# Patient Record
Sex: Female | Born: 1965 | Race: Black or African American | Hispanic: No | Marital: Single | Smoking: Current every day smoker
Health system: Southern US, Community
[De-identification: ages and names within clinical notes are randomized; demographics above are authoritative.]

## PROBLEM LIST (undated history)

## (undated) DIAGNOSIS — F329 Major depressive disorder, single episode, unspecified: Secondary | ICD-10-CM

## (undated) DIAGNOSIS — F32A Depression, unspecified: Secondary | ICD-10-CM

## (undated) DIAGNOSIS — I499 Cardiac arrhythmia, unspecified: Secondary | ICD-10-CM

## (undated) DIAGNOSIS — F17200 Nicotine dependence, unspecified, uncomplicated: Secondary | ICD-10-CM

## (undated) DIAGNOSIS — F1910 Other psychoactive substance abuse, uncomplicated: Secondary | ICD-10-CM

## (undated) DIAGNOSIS — I10 Essential (primary) hypertension: Secondary | ICD-10-CM

## (undated) DIAGNOSIS — B009 Herpesviral infection, unspecified: Secondary | ICD-10-CM

## (undated) DIAGNOSIS — F419 Anxiety disorder, unspecified: Secondary | ICD-10-CM

## (undated) DIAGNOSIS — F1110 Opioid abuse, uncomplicated: Secondary | ICD-10-CM

## (undated) HISTORY — DX: Cardiac arrhythmia, unspecified: I49.9

## (undated) HISTORY — PX: TUBAL LIGATION: SHX77

## (undated) HISTORY — DX: Depression, unspecified: F32.A

## (undated) HISTORY — DX: Opioid abuse, uncomplicated: F11.10

## (undated) HISTORY — DX: Major depressive disorder, single episode, unspecified: F32.9

## (undated) HISTORY — DX: Herpesviral infection, unspecified: B00.9

## (undated) HISTORY — DX: Essential (primary) hypertension: I10

---

## 2010-12-06 ENCOUNTER — Observation Stay (HOSPITAL_COMMUNITY)
Admission: EM | Admit: 2010-12-06 | Discharge: 2010-12-07 | Payer: Self-pay | Source: Home / Self Care | Admitting: Emergency Medicine

## 2010-12-13 ENCOUNTER — Emergency Department (HOSPITAL_COMMUNITY)
Admission: EM | Admit: 2010-12-13 | Discharge: 2010-12-13 | Payer: Self-pay | Source: Home / Self Care | Admitting: Family Medicine

## 2011-02-01 ENCOUNTER — Encounter: Payer: Self-pay | Admitting: Family Medicine

## 2011-02-01 DIAGNOSIS — E1165 Type 2 diabetes mellitus with hyperglycemia: Secondary | ICD-10-CM | POA: Insufficient documentation

## 2011-02-01 DIAGNOSIS — I499 Cardiac arrhythmia, unspecified: Secondary | ICD-10-CM | POA: Insufficient documentation

## 2011-02-01 DIAGNOSIS — B009 Herpesviral infection, unspecified: Secondary | ICD-10-CM | POA: Insufficient documentation

## 2011-02-03 ENCOUNTER — Ambulatory Visit (INDEPENDENT_AMBULATORY_CARE_PROVIDER_SITE_OTHER): Payer: Medicaid Other | Admitting: Family Medicine

## 2011-02-03 ENCOUNTER — Encounter: Payer: Self-pay | Admitting: Family Medicine

## 2011-02-03 VITALS — BP 124/84 | HR 91 | Temp 98.0°F | Ht 69.0 in | Wt 175.1 lb

## 2011-02-03 DIAGNOSIS — IMO0001 Reserved for inherently not codable concepts without codable children: Secondary | ICD-10-CM

## 2011-02-03 DIAGNOSIS — I499 Cardiac arrhythmia, unspecified: Secondary | ICD-10-CM

## 2011-02-03 DIAGNOSIS — B009 Herpesviral infection, unspecified: Secondary | ICD-10-CM

## 2011-02-03 DIAGNOSIS — M791 Myalgia, unspecified site: Secondary | ICD-10-CM | POA: Insufficient documentation

## 2011-02-03 DIAGNOSIS — I10 Essential (primary) hypertension: Secondary | ICD-10-CM

## 2011-02-03 DIAGNOSIS — E119 Type 2 diabetes mellitus without complications: Secondary | ICD-10-CM

## 2011-02-03 LAB — CK: Total CK: 78 U/L (ref 7–177)

## 2011-02-03 NOTE — Assessment & Plan Note (Signed)
Pt believes this is related to crack cocaine use. No palpitations, CP, SOB. Heart RRR w/o murmur on exam today. Will monitor.

## 2011-02-03 NOTE — Patient Instructions (Addendum)
It was so nice meeting you! I'm glad to hear that you have been clean for 2 months.  Keep up the great work! Make sure you keep taking all of your medicines as prescribed.  I do not think that they are the cause of your achiness. We are checking some labs today.  I will call you if there is anything abnormal, otherwise I will send you a letter with the results. Please call Dr. Remus Blake office and have him fax all of his lab results and his office visit with you, so that we do not have repeat any labs that were just done. Please check your feet every night for sores or cuts. Please come back and see me for a well-woman exam, including a PAP smear and a breast exam.

## 2011-02-03 NOTE — Assessment & Plan Note (Signed)
Normotensive.  Will get initial BP from Dr. Remus Blake office. Pt is to have labs faxed.  If no CMET draw, will obtain one at next visit.

## 2011-02-03 NOTE — Assessment & Plan Note (Signed)
Has not had break out since diagnosed ~8 months ago.  On suppressive txt.

## 2011-02-03 NOTE — Assessment & Plan Note (Signed)
Will check CK and ESR. CMET likely done at endocrinologist.  Will have pt fax lab results so that we can have on file.  Likely not medication related, more likely related to depression vs h/o drug abuse vs diabetic neuropathy.  Will continue to monitor. Pt does not want to start medications at this time, could consider lyrica in the future.

## 2011-02-03 NOTE — Progress Notes (Signed)
Addended by: Terese Door on: 02/03/2011 03:54 PM   Modules accepted: Orders

## 2011-02-03 NOTE — Assessment & Plan Note (Signed)
Diagnosed ~15 yrs ago, seen by Dr. Lucianne Muss w/ endocrinology  Started on lantus and novolog, gabapentin for neuropathy A1c done at endo clinic, will have pt get labs faxed

## 2011-02-03 NOTE — Progress Notes (Signed)
Subjective:    Patient ID: Patricia Charles, female    DOB: [birth year], 45 y.o.   MRN: [id number]  This is a new pt coming in to establish care.  Has stopped using drugs 2 months ago, and has not seen a doctor in a "long time."  Was seen by Dr. Lucianne Muss (endocrinology) last month because she has known Diabetes, also found to have HTN at that time.  DM: Has had diagnosis for 15 yrs but has not been taking meds regularly until these past 2 months. A1c per report was in the 9's last month.  Saw opthalmologic yesterday, said there was no retinopathy, cataracts, etc.  Also has complication of peripheral neuropathy, started on gabapentin by Dr. Lucianne Muss.  Herpes: First and only outbreak ~8 months ago. Started on acyclovir. Not currently sexually active.  HTN: Dr. Lucianne Muss started her on lisinopril. No CP, SOB, dizziness, or other CV symptoms.  H/o drug abuse: Mostly crack cocaine and THC. Drank 2-3 drinks when doing drugs. Never anything IV.  Has been clean for 2 months, currently living in recovery house and doing well.  H/o depression: Thinks it is because she is not doing the drugs anymore.  Has therapy M-F at her residence. Has been Rx'ed abilify, visteril, celexa but has never taken them b/c she does not want to.  Achiness: Has been ~6 weeks, unsure if it is related to starting to take all of her medications. Back, shoulders, and arms are bad during the day, legs are bad at night. 7/10. Relieved by OTC analgesics.   Muscle Pain This is a new problem. The current episode started more than 1 month ago. The problem occurs constantly. The problem is unchanged. The context of the pain is unknown. The pain is present in the chest, lower back, left arm, right arm, right lower leg, right upper leg, left lower leg, left upper leg and upper back. The pain is medium. The symptoms are aggravated by nothing. Pertinent negatives include no abdominal pain, chest pain, constipation, diarrhea, dysuria, fatigue, fever,  headaches, joint swelling, nausea, rash, shortness of breath, vaginal discharge, vomiting, weakness or wheezing. Past treatments include OTC NSAID and acetaminophen. The treatment provided mild relief. There is no swelling present. She has been sleeping poorly. There is no history of chronic back pain, rheumatic disease or sickle cell disease.      Review of Systems  Constitutional: Negative for fever, chills, fatigue and unexpected weight change.  HENT: Negative for congestion, sore throat, rhinorrhea, sneezing, mouth sores, dental problem and voice change.   Eyes: Negative for photophobia and visual disturbance.  Respiratory: Negative for cough, chest tightness, shortness of breath and wheezing.   Cardiovascular: Negative for chest pain, palpitations and leg swelling.  Gastrointestinal: Negative for nausea, vomiting, abdominal pain, diarrhea and constipation.  Genitourinary: Negative for dysuria, urgency, flank pain, vaginal bleeding, vaginal discharge, difficulty urinating and genital sores.  Musculoskeletal: Positive for myalgias. Negative for joint swelling, arthralgias and gait problem.  Skin: Negative for rash.  Neurological: Negative for dizziness, syncope, weakness, light-headedness, numbness and headaches.  Psychiatric/Behavioral: Negative for suicidal ideas, hallucinations, confusion, self-injury, dysphoric mood and agitation. The patient is not nervous/anxious and is not hyperactive.        Objective:   Physical Exam  Constitutional: She is oriented to person, place, and time. She appears well-developed and well-nourished. No distress.  HENT:  Head: Normocephalic and atraumatic.  Right Ear: External ear normal.  Left Ear: External ear normal.  Nose: Nose normal.  Mouth/Throat:  Oropharynx is clear and moist. No oropharyngeal exudate.  Eyes: Conjunctivae and EOM are normal. Pupils are equal, round, and reactive to light.  Fundoscopic exam:      The right eye shows no  hemorrhage and no papilledema.       The left eye shows no hemorrhage and no papilledema.  Neck: Normal range of motion. Neck supple. No thyromegaly present.  Cardiovascular: Normal rate, regular rhythm and intact distal pulses.  Exam reveals no gallop and no friction rub.   No murmur heard. Pulmonary/Chest: Effort normal and breath sounds normal. She has no wheezes. She has no rales.  Abdominal: Soft. Bowel sounds are normal. She exhibits no distension and no mass. There is no tenderness. There is no guarding.  Musculoskeletal: Normal range of motion. She exhibits tenderness. She exhibits no edema.  Lymphadenopathy:    She has no cervical adenopathy.  Neurological: She is alert and oriented to person, place, and time. No cranial nerve deficit. Coordination normal.  Skin: Skin is warm and dry. No abrasion and no rash noted.  Psychiatric: She has a normal mood and affect. Her behavior is normal. Judgment and thought content normal.          Assessment & Plan:

## 2011-02-27 LAB — POCT I-STAT, CHEM 8
BUN: 16 mg/dL (ref 6–23)
Calcium, Ion: 1.12 mmol/L (ref 1.12–1.32)
Chloride: 101 mEq/L (ref 96–112)
Chloride: 92 mEq/L — ABNORMAL LOW (ref 96–112)
Creatinine, Ser: 1.2 mg/dL (ref 0.4–1.2)
Glucose, Bld: 204 mg/dL — ABNORMAL HIGH (ref 70–99)
Potassium: 3.9 mEq/L (ref 3.5–5.1)

## 2011-02-27 LAB — DIFFERENTIAL
Eosinophils Relative: 1 % (ref 0–5)
Lymphs Abs: 3.8 10*3/uL (ref 0.7–4.0)
Monocytes Absolute: 0.6 10*3/uL (ref 0.1–1.0)
Monocytes Relative: 7 % (ref 3–12)
Neutro Abs: 3.4 10*3/uL (ref 1.7–7.7)

## 2011-02-27 LAB — LIPASE, BLOOD: Lipase: 20 U/L (ref 11–59)

## 2011-02-27 LAB — BASIC METABOLIC PANEL
BUN: 11 mg/dL (ref 6–23)
Calcium: 8.5 mg/dL (ref 8.4–10.5)
Creatinine, Ser: 0.96 mg/dL (ref 0.4–1.2)
GFR calc non Af Amer: >60 mL/min (ref >60)
Glucose, Bld: 114 mg/dL — ABNORMAL HIGH (ref 70–99)

## 2011-02-27 LAB — CBC
HCT: 52.5 % — ABNORMAL HIGH (ref 36.0–46.0)
Hemoglobin: 18.3 g/dL — ABNORMAL HIGH (ref 12.0–15.0)
MCH: 29.7 pg (ref 26.0–34.0)
MCHC: 34.9 g/dL (ref 30.0–36.0)
MCV: 85.1 fL (ref 78.0–100.0)
Platelets: 202 10*3/uL (ref 150–400)

## 2011-02-27 LAB — RAPID URINE DRUG SCREEN, HOSP PERFORMED
Barbiturates: NOT DETECTED
Benzodiazepines: NOT DETECTED
Cocaine: POSITIVE — AB
Opiates: NOT DETECTED

## 2011-02-27 LAB — URINALYSIS, ROUTINE W REFLEX MICROSCOPIC
Protein, ur: 30 mg/dL — AB
Specific Gravity, Urine: 1.041 — ABNORMAL HIGH (ref 1.005–1.030)

## 2011-02-27 LAB — GLUCOSE, CAPILLARY
Glucose-Capillary: 196 mg/dL — ABNORMAL HIGH (ref 70–99)
Glucose-Capillary: 268 mg/dL — ABNORMAL HIGH (ref 70–99)

## 2011-02-27 LAB — COMPREHENSIVE METABOLIC PANEL
AST: 40 U/L — ABNORMAL HIGH (ref 0–37)
Albumin: 3.8 g/dL (ref 3.5–5.2)
Alkaline Phosphatase: 81 U/L (ref 39–117)
Calcium: 10.3 mg/dL (ref 8.4–10.5)
Chloride: 89 mEq/L — ABNORMAL LOW (ref 96–112)
GFR calc Af Amer: >60 mL/min (ref >60)
GFR calc non Af Amer: >60 mL/min (ref >60)
Potassium: 4.4 mEq/L (ref 3.5–5.1)
Total Bilirubin: 1.5 mg/dL — ABNORMAL HIGH (ref 0.3–1.2)

## 2011-02-27 LAB — URINE MICROSCOPIC-ADD ON

## 2011-04-19 ENCOUNTER — Encounter: Payer: Self-pay | Admitting: Family Medicine

## 2011-05-24 ENCOUNTER — Encounter: Payer: Medicaid Other | Admitting: Family Medicine

## 2011-08-02 ENCOUNTER — Ambulatory Visit: Payer: Medicaid Other | Admitting: Family Medicine

## 2011-08-10 ENCOUNTER — Encounter: Payer: Self-pay | Admitting: Family Medicine

## 2011-08-10 ENCOUNTER — Ambulatory Visit (INDEPENDENT_AMBULATORY_CARE_PROVIDER_SITE_OTHER): Payer: Medicaid Other | Admitting: Family Medicine

## 2011-08-10 ENCOUNTER — Other Ambulatory Visit (HOSPITAL_COMMUNITY)
Admission: RE | Admit: 2011-08-10 | Discharge: 2011-08-10 | Disposition: A | Discharge status: Home / Self Care | Payer: Medicaid Other | Source: Ambulatory Visit | Attending: Family Medicine | Admitting: Family Medicine

## 2011-08-10 DIAGNOSIS — B009 Herpesviral infection, unspecified: Secondary | ICD-10-CM

## 2011-08-10 DIAGNOSIS — Z0000 Encounter for general adult medical examination without abnormal findings: Secondary | ICD-10-CM

## 2011-08-10 DIAGNOSIS — Z124 Encounter for screening for malignant neoplasm of cervix: Secondary | ICD-10-CM

## 2011-08-10 DIAGNOSIS — Z01419 Encounter for gynecological examination (general) (routine) without abnormal findings: Secondary | ICD-10-CM

## 2011-08-10 DIAGNOSIS — Z23 Encounter for immunization: Secondary | ICD-10-CM

## 2011-08-10 DIAGNOSIS — E119 Type 2 diabetes mellitus without complications: Secondary | ICD-10-CM

## 2011-08-10 DIAGNOSIS — E785 Hyperlipidemia, unspecified: Secondary | ICD-10-CM

## 2011-08-10 DIAGNOSIS — I10 Essential (primary) hypertension: Secondary | ICD-10-CM

## 2011-08-10 DIAGNOSIS — N760 Acute vaginitis: Secondary | ICD-10-CM

## 2011-08-10 MED ORDER — TETANUS-DIPHTH-ACELL PERTUSSIS 5-2.5-18.5 LF-MCG/0.5 IM SUSP: 0.5000 mL | Freq: Once | INTRAMUSCULAR | Status: DC

## 2011-08-10 MED ORDER — ACYCLOVIR 400 MG PO TABS: 400.0000 mg | ORAL_TABLET | Freq: Two times a day (BID) | ORAL | Status: DC

## 2011-08-10 NOTE — Assessment & Plan Note (Signed)
Overall doing well, no concerns or questions. PAP done today. Next will be in 3 years as pt have never had an abnormal.

## 2011-08-10 NOTE — Assessment & Plan Note (Signed)
No outbreaks. Will refill acyclovir.

## 2011-08-10 NOTE — Assessment & Plan Note (Signed)
Diagnosis must have been made by Dr. Lucianne Muss as he started pravastatin.  Asked pt to have records/labs faxed so that we have them as well.  Will not make any changes at this time.

## 2011-08-10 NOTE — Progress Notes (Signed)
  Subjective:     Patricia Charles is a 45 y.o. female and is here for a comprehensive physical exam. The patient reports no problems.  History   Social History  . Marital Status: Single    Spouse Name: N/A    Number of Children: 1  . Years of Education: some colle   Occupational History  . Unemployed     Social History Main Topics  . Smoking status: Former Smoker -- 25 years    Types: Cigarettes    Quit date: 12/04/2010  . Smokeless tobacco: Never Used  . Alcohol Use: No     drank when she used  . Drug Use: Yes    Special: "Crack" cocaine, Marijuana     in past  . Sexually Active: Not Currently    Birth Control/ Protection: Surgical   Other Topics Concern  . Not on file   Social History Narrative   01/2011: Currently lives [facility] with daughter, Hal Morales, born in 2006; heterosexual, never been married; enjoys reading and attending NA mtgs    Health Maintenance  Topic Date Due  . Pap Smear  Oct 28, 1968  . Tetanus/tdap  12/19/1986    The following portions of the patient's history were reviewed and updated as appropriate: allergies, current medications, past family history, past medical history, past social history, past surgical history and problem list.  Review of Systems Constitutional: negative for chills and fevers Eyes: negative for irritation, redness and visual disturbance Ears, nose, mouth, throat, and face: negative for earaches, hoarseness, nasal congestion, sore mouth and sore throat Respiratory: negative for cough, dyspnea on exertion and wheezing Cardiovascular: negative for chest pain, chest pressure/discomfort, exertional chest pressure/discomfort, fatigue and syncope Gastrointestinal: negative for abdominal pain, change in bowel habits and constipation Genitourinary:negative for sexual problems and vaginal discharge and dysuria Integument/breast: negative for breast lump and breast tenderness Hematologic/lymphatic: negative for  bleeding Musculoskeletal:negative for back pain and myalgias Neurological: negative for dizziness, headaches, memory problems and weakness Behavioral/Psych: positive for fatigue, negative for abusive relationship, anxiety, depression, sleep disturbance and tobacco use Endocrine: negative for temperature intolerance   Objective:    BP 126/89  Pulse 71  Temp(Src) 98.2 F (36.8 C) (Oral)  Wt 215 lb 12.8 oz (97.886 kg) General appearance: alert, cooperative, appears stated age and no distress Head: Normocephalic, without obvious abnormality, atraumatic Eyes: conjunctivae/corneas clear. PERRL, EOM's intact. Fundi benign. Ears: normal TM's and external ear canals both ears Nose: Nares normal. Septum midline. Mucosa normal. No drainage or sinus tenderness. Throat: lips, mucosa, and tongue normal; teeth and gums normal Neck: no adenopathy, supple, symmetrical, trachea midline and thyroid not enlarged, symmetric, no tenderness/mass/nodules Lungs: clear to auscultation bilaterally Heart: regular rate and rhythm, S1, S2 normal, no murmur, click, rub or gallop Abdomen: soft, non-tender; bowel sounds normal; no masses,  no organomegaly Pelvic: cervix normal in appearance, external genitalia normal, no adnexal masses or tenderness, no cervical motion tenderness, rectovaginal septum normal, uterus normal size, shape, and consistency and vagina normal without discharge Extremities: extremities normal, atraumatic, no cyanosis or edema Pulses: 2+ and symmetric Skin: Skin color, texture, turgor normal. No rashes or lesions or eczema - ankle(s) bilateral    Assessment:    Healthy female exam. ?Contact dermatitis rash bilateral ankles     Plan:     See After Visit Summary for Counseling Recommendations

## 2011-08-10 NOTE — Assessment & Plan Note (Signed)
BP at goal, 126/89.  Lisinopril increased to 10mg  by Lucianne Muss. Continue.

## 2011-08-10 NOTE — Assessment & Plan Note (Signed)
Last A1c, per pt, was 6.  Med changes by Dr. Lucianne Muss.  Informed pt she could have DM followed here if she would like.  Next appt w/ Lucianne Muss in Sept.

## 2011-08-10 NOTE — Patient Instructions (Signed)
Everything looks great! If you and Dr. Lucianne Muss would like, we can start following you for all of your chronic issues such as Diabetes, High Cholesterol, etc.  Just let me know if you would prefer that. The rash on your ankles looks like an irritation from either bug bites, shoes, or walking through something.  You can try over the counter hydrocortisone cream or anti-itch creams/lotions.  If these do not help please come back and we can try something prescription strength. If you would like for [place] to manage your medical conditions, please come back in 3 months, otherwise, come back in 1 year for your next well woman exam.  You will NOT need a pap smear at that time. It was great to see you!

## 2011-08-14 ENCOUNTER — Encounter: Payer: Self-pay | Admitting: Family Medicine

## 2011-12-21 ENCOUNTER — Other Ambulatory Visit: Payer: Self-pay | Admitting: Family Medicine

## 2011-12-21 MED ORDER — METFORMIN HCL ER 750 MG PO TB24: 1500.0000 mg | ORAL_TABLET | Freq: Every day | ORAL | Status: DC

## 2012-01-29 ENCOUNTER — Emergency Department (HOSPITAL_COMMUNITY)
Admission: EM | Admit: 2012-01-29 | Discharge: 2012-01-30 | Disposition: A | Discharge status: Home / Self Care | Payer: Medicaid Other | Attending: Emergency Medicine | Admitting: Emergency Medicine

## 2012-01-29 ENCOUNTER — Encounter (HOSPITAL_COMMUNITY): Payer: Self-pay | Admitting: Emergency Medicine

## 2012-01-29 DIAGNOSIS — I10 Essential (primary) hypertension: Secondary | ICD-10-CM | POA: Insufficient documentation

## 2012-01-29 DIAGNOSIS — E119 Type 2 diabetes mellitus without complications: Secondary | ICD-10-CM | POA: Insufficient documentation

## 2012-01-29 DIAGNOSIS — Z79899 Other long term (current) drug therapy: Secondary | ICD-10-CM | POA: Insufficient documentation

## 2012-01-29 DIAGNOSIS — A5903 Trichomonal cystitis and urethritis: Secondary | ICD-10-CM | POA: Insufficient documentation

## 2012-01-29 DIAGNOSIS — R3589 Other polyuria: Secondary | ICD-10-CM | POA: Insufficient documentation

## 2012-01-29 DIAGNOSIS — F3289 Other specified depressive episodes: Secondary | ICD-10-CM | POA: Insufficient documentation

## 2012-01-29 DIAGNOSIS — F329 Major depressive disorder, single episode, unspecified: Secondary | ICD-10-CM | POA: Insufficient documentation

## 2012-01-29 DIAGNOSIS — N390 Urinary tract infection, site not specified: Secondary | ICD-10-CM

## 2012-01-29 DIAGNOSIS — R358 Other polyuria: Secondary | ICD-10-CM | POA: Insufficient documentation

## 2012-01-29 DIAGNOSIS — Z794 Long term (current) use of insulin: Secondary | ICD-10-CM | POA: Insufficient documentation

## 2012-01-29 DIAGNOSIS — R631 Polydipsia: Secondary | ICD-10-CM | POA: Insufficient documentation

## 2012-01-29 LAB — DIFFERENTIAL
Basophils Absolute: 0 10*3/uL (ref 0.0–0.1)
Basophils Relative: 0 % (ref 0–1)
Lymphocytes Relative: 48 % — ABNORMAL HIGH (ref 12–46)
Monocytes Absolute: 0.5 10*3/uL (ref 0.1–1.0)
Monocytes Relative: 7 % (ref 3–12)
Neutro Abs: 2.9 10*3/uL (ref 1.7–7.7)
Neutrophils Relative %: 42 % — ABNORMAL LOW (ref 43–77)

## 2012-01-29 LAB — GLUCOSE, CAPILLARY

## 2012-01-29 LAB — URINALYSIS, ROUTINE W REFLEX MICROSCOPIC
Bilirubin Urine: NEGATIVE
Nitrite: NEGATIVE
Specific Gravity, Urine: 1.033 — ABNORMAL HIGH (ref 1.005–1.030)
Urobilinogen, UA: 0.2 mg/dL (ref 0.0–1.0)
pH: 6 (ref 5.0–8.0)

## 2012-01-29 LAB — CBC
HCT: 41.4 % (ref 36.0–46.0)
Hemoglobin: 14 g/dL (ref 12.0–15.0)
MCHC: 33.8 g/dL (ref 30.0–36.0)
RDW: 13.7 % (ref 11.5–15.5)
WBC: 6.9 10*3/uL (ref 4.0–10.5)

## 2012-01-29 LAB — URINE MICROSCOPIC-ADD ON

## 2012-01-29 MED ORDER — SODIUM CHLORIDE 0.9 % IV BOLUS (SEPSIS)
1000.0000 mL | Freq: Once | INTRAVENOUS | Status: AC
  Administered 2012-01-29: 1000 mL via INTRAVENOUS

## 2012-01-29 NOTE — ED Notes (Signed)
Pt states she has been out of her medications  Pt states she has been very thirsty especially today  Pt states she started back on her diabetic medication metformin today Pt states this evening she started having numbness in her arms and hands  Pt states she tried to check her sugar at home but the machine would not read because it was too high

## 2012-01-29 NOTE — ED Notes (Signed)
CBG greater than 600 per glucometer

## 2012-01-29 NOTE — ED Provider Notes (Signed)
History     CSN: [id number]  Arrival date & time 01/29/12  6213   First MD Initiated Contact with Patient 01/29/12 2307      Chief Complaint  Patient presents with  . Hyperglycemia    (Consider location/radiation/quality/duration/timing/severity/associated sxs/prior treatment) The history is provided by the patient. No language interpreter was used.  Patient presents with hyperglycemia.  Has not bee checking sugars but has been out of meds for over a week.  No insulin no metformin and checked sugar today and it would not read because it was too high.  No CP, sob n/v/d.  No f/c/r.  Polyuria and increased thirst.  Denies weakness numbness or visual changes.  Past Medical History  Diagnosis Date  . Diabetes mellitus   . Irregular heart beat   . Depression     Was taking abilify, celexa  . Herpes     Takes acyclovir BID  . Narcotic abuse     Attends NA mtgs  . Hypertension     Past Surgical History  Procedure Date  . Tubal ligation     Family History  Problem Relation Age of Onset  . Heart disease Mother   . Hypertension Mother   . Coronary artery disease Mother     History  Substance Use Topics  . Smoking status: Former Smoker -- 25 years    Types: Cigarettes    Quit date: 12/04/2010  . Smokeless tobacco: Never Used  . Alcohol Use: No     drank when she used    OB History    Grav Para Term Preterm Abortions TAB SAB Ect Mult Living   4 2 0  2 1 1   2       Review of Systems  Constitutional: Negative.   HENT: Negative.   Eyes: Negative.   Respiratory: Negative for chest tightness and shortness of breath.   Cardiovascular: Negative for chest pain.  Gastrointestinal: Negative for abdominal distention.  Genitourinary: Negative for decreased urine volume and difficulty urinating.  Musculoskeletal: Negative.   Skin: Negative.   Neurological: Negative.   Hematological: Negative.   Psychiatric/Behavioral: Negative.     Allergies  Review of patient's  allergies indicates no known allergies.  Home Medications   Current Outpatient Rx  Name Route Sig Dispense Refill  . ACYCLOVIR 400 MG PO TABS Oral Take 1 tablet (400 mg total) by mouth 2 (two) times daily. 60 tablet 11  . GABAPENTIN 300 MG PO CAPS Oral Take 300 mg by mouth 3 (three) times daily.     . INSULIN ASPART 100 UNIT/ML [place] SOLN Subcutaneous Inject 2 Units into the skin 3 (three) times daily before meals.     . INSULIN GLARGINE 100 UNIT/ML [place] SOLN Subcutaneous Inject 14 Units into the skin at bedtime.     Marland Kitchen LISINOPRIL 5 MG PO TABS Oral Take 10 mg by mouth daily.     Marland Kitchen METFORMIN HCL ER 750 MG PO TB24 Oral Take 2 tablets (1,500 mg total) by mouth daily with supper. 60 tablet 5  . PRAVASTATIN SODIUM 10 MG PO TABS Oral Take 1 tablet (10 mg total) by mouth daily.      BP 124/87  Pulse 81  Temp(Src) 97.7 F (36.5 C) (Oral)  Resp 20  SpO2 100%  LMP 01/14/2012  Physical Exam  Constitutional: She is oriented to person, place, and time. She appears well-developed and well-nourished. No distress.  HENT:  Head: Normocephalic and atraumatic.  Mouth/Throat: Oropharynx is clear and moist.  Eyes: Conjunctivae are normal. Pupils are equal, round, and reactive to light.  Neck: Normal range of motion. Neck supple.  Cardiovascular: Normal rate and regular rhythm.   Pulmonary/Chest: Effort normal and breath sounds normal. She has no wheezes. She has no rales.  Abdominal: Soft. Bowel sounds are normal. There is no tenderness. There is no rebound and no guarding.  Musculoskeletal: Normal range of motion. She exhibits no edema.  Neurological: She is alert and oriented to person, place, and time.  Skin: Skin is warm and dry.  Psychiatric: She has a normal mood and affect.    ED Course  Procedures (including critical care time)  Labs Reviewed  GLUCOSE, CAPILLARY - Abnormal; Notable for the following:    Glucose-Capillary >600 (*)    All other components within normal limits  GLUCOSE,  CAPILLARY - Abnormal; Notable for the following:    Glucose-Capillary >600 (*)    All other components within normal limits  CBC  DIFFERENTIAL  URINALYSIS, ROUTINE W REFLEX MICROSCOPIC  URINALYSIS, ROUTINE W REFLEX MICROSCOPIC  URINE CULTURE   No results found.   No diagnosis found.    MDM  Sugar improved feels well.  Informed patient about trichomonas and need to report same to all partners and remain abstinent until 7 days until after she and all her partners are treated.  Take all abx.    Case d/w Dr. Domenick Bookbinder [facility] family practice will get her a close follow up        Eriyana Sweeten Smitty Cords, MD 01/30/12 [phone]

## 2012-01-30 ENCOUNTER — Telehealth: Payer: Self-pay | Admitting: Family Medicine

## 2012-01-30 LAB — GLUCOSE, CAPILLARY
Glucose-Capillary: 548 mg/dL — ABNORMAL HIGH (ref 70–99)
Glucose-Capillary: 362 mg/dL — ABNORMAL HIGH (ref 70–99)

## 2012-01-30 LAB — POCT I-STAT, CHEM 8
Chloride: 93 mEq/L — ABNORMAL LOW (ref 96–112)
Glucose, Bld: >700 mg/dL (ref 70–99)
HCT: 48 % — ABNORMAL HIGH (ref 36.0–46.0)
Hemoglobin: 16.3 g/dL — ABNORMAL HIGH (ref 12.0–15.0)
Potassium: 4.9 mEq/L (ref 3.5–5.1)
Sodium: 125 mEq/L — ABNORMAL LOW (ref 135–145)

## 2012-01-30 MED ORDER — CIPROFLOXACIN HCL 500 MG PO TABS: 500.0000 mg | ORAL_TABLET | Freq: Two times a day (BID) | ORAL | Status: DC

## 2012-01-30 MED ORDER — INSULIN ASPART 100 UNIT/ML ~~LOC~~ SOLN
SUBCUTANEOUS | Status: AC
  Administered 2012-01-30: 8 [IU] via INTRAVENOUS
  Filled 2012-01-30: qty 1

## 2012-01-30 MED ORDER — SODIUM CHLORIDE 0.9 % IV BOLUS (SEPSIS)
500.0000 mL | Freq: Once | INTRAVENOUS | Status: AC
  Administered 2012-01-30: 500 mL via INTRAVENOUS

## 2012-01-30 MED ORDER — INSULIN REGULAR HUMAN 100 UNIT/ML IJ SOLN
8.0000 [IU] | Freq: Once | INTRAMUSCULAR | Status: AC
  Administered 2012-01-30: 8 [IU] via INTRAVENOUS
  Filled 2012-01-30 (×2): qty 0.08

## 2012-01-30 MED ORDER — METRONIDAZOLE 500 MG PO TABS: 2000.0000 mg | ORAL_TABLET | Freq: Once | ORAL | Status: DC

## 2012-01-30 MED ORDER — INSULIN ASPART 100 UNIT/ML ~~LOC~~ SOLN
SUBCUTANEOUS | Status: AC
  Administered 2012-01-30: 6 [IU] via INTRAVENOUS
  Filled 2012-01-30: qty 1

## 2012-01-30 MED ORDER — METRONIDAZOLE 500 MG PO TABS
2000.0000 mg | ORAL_TABLET | Freq: Once | ORAL | Status: AC
  Administered 2012-01-30: 2000 mg via ORAL
  Filled 2012-01-30: qty 4

## 2012-01-30 MED ORDER — SODIUM CHLORIDE 0.9 % IV BOLUS (SEPSIS)
1000.0000 mL | Freq: Once | INTRAVENOUS | Status: AC
  Administered 2012-01-30: 1000 mL via INTRAVENOUS

## 2012-01-30 MED ORDER — INSULIN REGULAR HUMAN 100 UNIT/ML IJ SOLN
6.0000 [IU] | Freq: Once | INTRAMUSCULAR | Status: AC
  Administered 2012-01-30: 6 [IU] via INTRAVENOUS
  Filled 2012-01-30: qty 0.06

## 2012-01-30 MED ORDER — INSULIN REGULAR HUMAN 100 UNIT/ML IJ SOLN: 6.0000 [IU] | Freq: Once | INTRAMUSCULAR | Status: DC

## 2012-01-30 NOTE — Telephone Encounter (Signed)
Was called by ED at [facility].  Patient was seen in ED and found to have CBG > 700.  ED was able to bring it down to 200.  Patient says she ran out of all medications and therefore needs an appointment with PCP as soon as possible.  She was also found to have trichomonas infection and will need repeat UA and urine cultures outpatient.  Thank you.

## 2012-01-30 NOTE — Discharge Instructions (Signed)
Blood Sugar Monitoring, Adult GLUCOSE METERS FOR SELF-MONITORING OF BLOOD GLUCOSE   It is important to be able to correctly measure your blood sugar (glucose). You can use a blood glucose monitor (a small battery-operated device) to check your glucose level at any time. This allows you and your caregiver to monitor your diabetes and to determine how well your treatment plan is working. The process of monitoring your blood glucose with a glucose meter is called self-monitoring of blood glucose (SMBG). When people with diabetes control their blood sugar, they have better health. To test for glucose with a typical glucose meter, place the disposable strip in the meter. Then place a small sample of blood on the "test strip." The test strip is coated with chemicals that combine with glucose in blood. The meter measures how much glucose is present. The meter displays the glucose level as a number. Several new models can record and store a number of test results. Some models can connect to personal computers to store test results or print them out.   Newer meters are often easier to use than older models. Some meters allow you to get blood from places other than your fingertip. Some new models have automatic timing, error codes, signals, or barcode readers to help with proper adjustment (calibration). Some meters have a large display screen or spoken instructions for people with visual impairments.   INSTRUCTIONS FOR USING GLUCOSE METERS  Wash your hands with soap and warm water, or clean the area with alcohol. Dry your hands completely.     Prick the side of your fingertip with a lancet (a sharp-pointed tool used by hand).     Hold the hand down and gently milk the finger until a small drop of blood appears. Catch the blood with the test strip.     Follow the instructions for inserting the test strip and using the SMBG meter. Most meters require the meter to be turned on and the test strip to be inserted  before applying the blood sample.     Record the test result.     Read the instructions carefully for both the meter and the test strips that go with it. Meter instructions are found in the user manual. Keep this manual to help you solve any problems that may arise. Many meters use "error codes" when there is a problem with the meter, the test strip, or the blood sample on the strip. You will need the manual to understand these error codes and fix the problem.     New devices are available such as laser lancets and meters that can test blood taken from "alternative sites" of the body, other than fingertips. However, you should use standard fingertip testing if your glucose changes rapidly. Also, use standard testing if:     You have eaten, exercised, or taken insulin in the past 2 hours.     You think your glucose is low.     You tend to not feel symptoms of low blood glucose (hypoglycemia).     You are ill or under stress.     Clean the meter as directed by the manufacturer.     Test the meter for accuracy as directed by the manufacturer.     Take your meter with you to your caregiver's office. This way, you can test your glucose in front of your caregiver to make sure you are using the meter correctly. Your caregiver can also take a sample of blood to test   using a routine lab method. If values on the glucose meter are close to the lab results, you and your caregiver will see that your meter is working well and you are using good technique. Your caregiver will advise you about what to do if the results do not match.  FREQUENCY OF TESTING   Your caregiver will tell you how often you should check your blood glucose. This will depend on your type of diabetes, your current level of diabetes control, and your types of medicines. The following are general guidelines, but your care plan may be different. Record all your readings and the time of day you took them for review with your caregiver.     Diabetes type 1.     When you are using insulin with good diabetic control (either multiple daily injections or via a pump), you should check your glucose 4 times a day.     If your diabetes is not well controlled, you may need to monitor more frequently, including before meals and 2 hours after meals, at bedtime, and occasionally between 2 a.m. and 3 a.m.     You should always check your glucose before a dose of insulin or before changing the rate on your insulin pump.     Diabetes type 2.     Guidelines for SMBG in diabetes type 2 are not as well defined.     If you are on insulin, follow the guidelines above.     If you are on medicines, but not insulin, and your glucose is not well controlled, you should test at least twice daily.     If you are not on insulin, and your diabetes is controlled with medicines or diet alone, you should test at least once daily, usually before breakfast.     A weekly profile will help your caregiver advise you on your care plan. The week before your visit, check your glucose before a meal and 2 hours after a meal at least daily. You may want to test before and after a different meal each day so you and your caregiver can tell how well controlled your blood sugars are throughout the course of a 24 hour period.     Gestational diabetes (diabetes during pregnancy).     Frequent testing is often necessary. Accurate timing is important.     If you are not on insulin, check your glucose 4 times a day. Check it before breakfast and 1 hour after the start of each meal.     If you are on insulin, check your glucose 6 times a day. Check it before each meal and 1 hour after the first bite of each meal.     General guidelines.     More frequent testing is required at the start of insulin treatment. Your caregiver will instruct you.     Test your glucose any time you suspect you have low blood sugar (hypoglycemia).     You should test more often when you  change medicines, when you have unusual stress or illness, or in other unusual circumstances.  OTHER THINGS TO KNOW ABOUT GLUCOSE METERS  Measurement Range. Most glucose meters are able to read glucose levels over a broad range of values from as low as 0 to as high as 600 mg/dL. If you get an extremely high or low reading from your meter, you should first confirm it with another reading. Report very high or very low readings to your caregiver.       Whole Blood Glucose versus Plasma Glucose. Some older home glucose meters measure glucose in your whole blood. In a lab or when using some newer home glucose meters, the glucose is measured in your plasma (one component of blood). The difference can be important. It is important for you and your caregiver to know whether your meter gives its results as "whole blood equivalent" or "plasma equivalent."     Display of High and Low Glucose Values. Part of learning how to operate a meter is understanding what the meter results mean. Know how high and low glucose concentrations are displayed on your meter.     Factors that Affect Glucose Meter Performance. The accuracy of your test results depends on many factors and varies depending on the brand and type of meter. These factors include:     Low red blood cell count (anemia).     Substances in your blood (such as uric acid, vitamin C, and others).     Environmental factors (temperature, humidity, altitude).     Name-brand versus generic test strips.     Calibration. Make sure your meter is set up properly. It is a good idea to do a calibration test with a control solution recommended by the manufacturer of your meter whenever you begin using a fresh bottle of test strips. This will help verify the accuracy of your meter.     Improperly stored, expired, or defective test strips. Keep your strips in a dry place with the lid on.     Soiled meter.     Inadequate blood sample.  NEW TECHNOLOGIES FOR GLUCOSE  TESTING Alternative site testing Some glucose meters allow testing blood from alternative sites. These include the:  Upper arm.     Forearm.    Base of the thumb.     Thigh.  Sampling blood from alternative sites may be desirable. However, it may have some limitations. Blood in the fingertips show changes in glucose levels more quickly than blood in other parts of the body. This means that alternative site test results may be different from fingertip test results, not because of the meter's ability to test accurately, but because the actual glucose concentration can be different.   Continuous Glucose Monitoring Devices to measure your blood glucose continuously are available, and others are in development. These methods can be more expensive than self-monitoring with a glucose meter. However, it is uncertain how effective and reliable these devices are. Your caregiver will advise you if this approach makes sense for you. IF BLOOD SUGARS ARE CONTROLLED, PEOPLE WITH DIABETES REMAIN HEALTHIER.   SMBG is an important part of the treatment plan of patients with diabetes mellitus. Below are reasons for using SMBG:    It confirms that your glucose is at a specific, healthy level.     It detects hypoglycemia and severe hyperglycemia.     It allows you and your caregiver to make adjustments in response to changes in lifestyle for individuals requiring medicine.     It determines the need for starting insulin therapy in temporary diabetes that happens during pregnancy (gestational diabetes).  Document Released: 12/07/2003 Document Revised: 08/17/2011 Document Reviewed: 03/30/2011 [employer] Patient Information 2012 [employer]. 

## 2012-01-30 NOTE — ED Notes (Signed)
CBG 134 by K.H.

## 2012-01-30 NOTE — ED Notes (Signed)
#  2 bolus completed . Insulin 8 units given iv as ordered

## 2012-01-31 ENCOUNTER — Encounter: Payer: Self-pay | Admitting: Family Medicine

## 2012-01-31 DIAGNOSIS — E1140 Type 2 diabetes mellitus with diabetic neuropathy, unspecified: Secondary | ICD-10-CM | POA: Insufficient documentation

## 2012-01-31 LAB — URINE CULTURE: Culture  Setup Time: 201302120613

## 2012-01-31 NOTE — Telephone Encounter (Signed)
Called patient and set an appt with Dr Adrian Blackwater since Dr Fara Boros was not available until March.  I called Dr Remus Blake office and they faxed over her last OV notes and it has been placed in Dr Jamie Kato box.

## 2012-02-01 ENCOUNTER — Encounter: Payer: Self-pay | Admitting: Family Medicine

## 2012-02-01 ENCOUNTER — Ambulatory Visit (INDEPENDENT_AMBULATORY_CARE_PROVIDER_SITE_OTHER): Payer: Medicaid Other | Admitting: Family Medicine

## 2012-02-01 DIAGNOSIS — E119 Type 2 diabetes mellitus without complications: Secondary | ICD-10-CM

## 2012-02-01 DIAGNOSIS — B351 Tinea unguium: Secondary | ICD-10-CM

## 2012-02-01 LAB — GLUCOSE, CAPILLARY: Glucose-Capillary: 414 mg/dL — ABNORMAL HIGH (ref 70–99)

## 2012-02-01 NOTE — Patient Instructions (Addendum)
It was good to see today Your hemoglobin A1c was 13 today Please take your metformin daily. Please take your Lantus 14 units at bedtime. Please take your insulin NovoLog (Humalog) 2 units 3 times a day 30 minutes before meals. Please take your blood sugars before each meal and at bedtime and right these down into your logbook.  Please exercise 20-30 minutes 4 times a week. A fast walk would be an appropriate exercise to start with. Walk at a fast pace, but still being able to carry on a conversation. I recommend decreasing your diet of grains (including rice and corn) and potatoes.  You should also eat more vegetables, which would replace the potatoes.  Please follow up in two weeks and bring your log book to see if we need to change your insulin.

## 2012-02-01 NOTE — Progress Notes (Signed)
  Subjective:    Patient ID: Patricia Charles, female    DOB: 01-28-1966, 46 y.o.   MRN: [id number]  HPI Patient seen for diabetes.  Her diabetes is currently uncontrolled.  She had been seeing endocrinology, but has not seen him for several month.  She was seen in the ED last week with blood sugars in 600-700, which was managed.  She has been non-compliant with her medication and has not been taking her humalog with meals.  She has continued to take her metformin.  She eats one meal per day and drinks fruit juice for breakfast.  She admits to polyuria and polydypsia.  No hypoglycemic periods.   Review of Systems     Objective:   Physical Exam  Constitutional: She is oriented to person, place, and time. She appears well-developed and well-nourished.  Neurological: She is alert and oriented to person, place, and time.  Skin: Skin is warm and dry.       Fungal toenail infection.  Psychiatric: She has a normal mood and affect. Her behavior is normal. Judgment and thought content normal.          Assessment & Plan:

## 2012-02-01 NOTE — Assessment & Plan Note (Signed)
Continue with metformin and insulin as scheduled.  Discussed regular use of humalog and small meals TID, rather than one large meal.  Also discussed foods, exercise.  F/u in 2 weeks.

## 2012-02-01 NOTE — Assessment & Plan Note (Signed)
Referral to podiatry  

## 2012-03-14 ENCOUNTER — Ambulatory Visit (INDEPENDENT_AMBULATORY_CARE_PROVIDER_SITE_OTHER): Payer: Medicaid Other | Admitting: Family Medicine

## 2012-03-14 ENCOUNTER — Encounter: Payer: Self-pay | Admitting: Family Medicine

## 2012-03-14 VITALS — BP 96/63 | HR 105 | Temp 99.4°F | Ht 71.0 in | Wt 225.0 lb

## 2012-03-14 DIAGNOSIS — E119 Type 2 diabetes mellitus without complications: Secondary | ICD-10-CM

## 2012-03-14 DIAGNOSIS — J111 Influenza due to unidentified influenza virus with other respiratory manifestations: Secondary | ICD-10-CM

## 2012-03-14 DIAGNOSIS — R51 Headache: Secondary | ICD-10-CM

## 2012-03-14 DIAGNOSIS — R6889 Other general symptoms and signs: Secondary | ICD-10-CM

## 2012-03-14 MED ORDER — KETOROLAC TROMETHAMINE 60 MG/2ML IJ SOLN
30.0000 mg | Freq: Once | INTRAMUSCULAR | Status: DC
  Administered 2012-03-14: 30 mg via INTRAMUSCULAR

## 2012-03-14 NOTE — Patient Instructions (Signed)
Thank you for coming in today, it was good to see you I think you have a virus that is the flu or similar to the flu Antibiotics are not effective for viral illnesses The most important things for you to do are to rest, drink plenty of fluids.  You can use ibuprofen or tylenol for aches and pains.  Be careful because some cold medications may already have ibuprofen or tylenol in them so check the label. If you are not beginning to feel better by the first part of next week you can come back to see [place].

## 2012-03-21 DIAGNOSIS — R6889 Other general symptoms and signs: Secondary | ICD-10-CM | POA: Insufficient documentation

## 2012-03-21 NOTE — Assessment & Plan Note (Signed)
Patient with flu like illness,  Reports negative rapid flu at ED, ? False negative vs another virus causing flu like symptoms.  Advised continued supportive treatment, rest, push fluids.  Expect to begin to improve within a few days, advised to return if still no better in 7 days.

## 2012-03-21 NOTE — Progress Notes (Signed)
  Subjective:    Patient ID: Patricia Charles, female    DOB: Sep 06, 1966, 46 y.o.   MRN: [id number]  HPI 1.  URI:  Comes in today with complaint of cough, congestion, sore throat,  Headache, fever, and myalgias.  Symptoms started ~6 days ago.  Was evaluated at [facility] ED over the weekend.  States she had CXR and flu test which were both negative, and was diagnosed with a URI.  She states she has not improved since that time.  She is currently using mucinex and alka seltzer cold and sinus.  She denies nausea, vomiting, dysuria, flank pain, chest pain, shortness of breath.    Review of Systems Per HPI    Objective:   Physical Exam  Constitutional: She appears well-nourished.       Lying on exam table, appears moderately uncomfortable.  HENT:  Head: Normocephalic and atraumatic.  Right Ear: Tympanic membrane normal.  Left Ear: Tympanic membrane normal.  Nose: No mucosal edema or rhinorrhea. Right sinus exhibits no maxillary sinus tenderness and no frontal sinus tenderness. Left sinus exhibits no maxillary sinus tenderness and no frontal sinus tenderness.  Mouth/Throat: Mucous membranes are normal. Posterior oropharyngeal erythema present. No oropharyngeal exudate.  Neck: Neck supple.  Cardiovascular: Normal rate, regular rhythm and normal heart sounds.   Pulmonary/Chest: Effort normal and breath sounds normal.  Lymphadenopathy:    She has no cervical adenopathy.  Neurological: She is alert.          Assessment & Plan:  See problem list

## 2012-03-25 MED ORDER — KETOROLAC TROMETHAMINE 30 MG/ML IJ SOLN: 30.0000 mg | Freq: Once | INTRAMUSCULAR | Status: DC

## 2012-03-25 NOTE — Progress Notes (Signed)
Addended by: Barnie Alderman on: 03/25/2012 04:42 PM   Modules accepted: Orders

## 2012-03-27 MED ORDER — KETOROLAC TROMETHAMINE 30 MG/ML IJ SOLN: 30.0000 mg | Freq: Once | INTRAMUSCULAR | Status: DC

## 2012-03-27 MED ORDER — KETOROLAC TROMETHAMINE 30 MG/ML IJ SOLN
30.0000 mg | Freq: Once | INTRAMUSCULAR | Status: AC
  Administered 2012-03-27: 30 mg via INTRAMUSCULAR

## 2012-03-27 NOTE — Progress Notes (Signed)
Addended by: Burna Mortimer E on: 03/27/2012 12:11 PM   Modules accepted: Orders

## 2012-05-03 ENCOUNTER — Encounter (HOSPITAL_COMMUNITY): Payer: Self-pay | Admitting: Emergency Medicine

## 2012-05-03 ENCOUNTER — Emergency Department (INDEPENDENT_AMBULATORY_CARE_PROVIDER_SITE_OTHER)
Admission: EM | Admit: 2012-05-03 | Discharge: 2012-05-03 | Disposition: A | Discharge status: Home / Self Care | Payer: Self-pay | Source: Home / Self Care | Attending: Emergency Medicine | Admitting: Emergency Medicine

## 2012-05-03 DIAGNOSIS — N760 Acute vaginitis: Secondary | ICD-10-CM

## 2012-05-03 DIAGNOSIS — A6000 Herpesviral infection of urogenital system, unspecified: Secondary | ICD-10-CM

## 2012-05-03 LAB — WET PREP, GENITAL: Trich, Wet Prep: NONE SEEN

## 2012-05-03 MED ORDER — FLUCONAZOLE 150 MG PO TABS: 150.0000 mg | ORAL_TABLET | Freq: Once | ORAL | Status: AC

## 2012-05-03 MED ORDER — ACYCLOVIR 400 MG PO TABS: 400.0000 mg | ORAL_TABLET | Freq: Three times a day (TID) | ORAL | Status: DC

## 2012-05-03 NOTE — ED Notes (Addendum)
Pt states she has had herpes for several years and she takes acyclovir daily and has not had an outbreak since diagnosis. Pt recently lost her job and has none of her medications. She states symptoms started about a week ago and this is the worse outbreak she has ever had, she says there is drainage and severe pain. Pt has tried hot baths and showers with no relief.

## 2012-05-03 NOTE — ED Provider Notes (Signed)
History     CSN: [id number]  Arrival date & time 05/03/12  0820   First MD Initiated Contact with Patient 05/03/12 0825      Chief Complaint  Patient presents with  . SEXUALLY TRANSMITTED DISEASE    (Consider location/radiation/quality/duration/timing/severity/associated sxs/prior treatment) HPI Comments: Patient presents urgent care describing how she has run out of her medicines as she lost her job and for several days have been having what she thinks is a new herpes outbreak in her external genitalia been burning itching and sore. Have also noticed a discharge in this also suspect that she might have a yeast infection. She had been checking her sugars and they have been anywhere from 160s to the 200s, "my sugars have not been very bad" . Denies any fevers, abdominal pain, nausea or vomiting. Also denies any constitutional symptoms such as fatigue lack of appetite or fevers.  Patient is a 46 y.o. female presenting with vaginal discharge. The history is provided by the patient.  Vaginal Discharge This is a new problem. The problem occurs constantly. The problem has not changed since onset.Pertinent negatives include no abdominal pain, no headaches and no shortness of breath. The symptoms are aggravated by nothing. The symptoms are relieved by nothing.    Past Medical History  Diagnosis Date  . Diabetes mellitus   . Irregular heart beat   . Depression     Was taking abilify, celexa  . Herpes     Takes acyclovir BID  . Narcotic abuse     Attends NA mtgs  . Hypertension     Past Surgical History  Procedure Date  . Tubal ligation     Family History  Problem Relation Age of Onset  . Heart disease Mother   . Hypertension Mother   . Coronary artery disease Mother     History  Substance Use Topics  . Smoking status: Former Smoker -- 25 years    Types: Cigarettes    Quit date: 12/04/2010  . Smokeless tobacco: Never Used  . Alcohol Use: No     drank when she used    OB  History    Grav Para Term Preterm Abortions TAB SAB Ect Mult Living   4 2 0  2 1 1   2       Review of Systems  Constitutional: Negative for fever, activity change, appetite change and fatigue.  Respiratory: Negative for shortness of breath.   Gastrointestinal: Negative for abdominal pain.  Genitourinary: Positive for vaginal discharge, genital sores and vaginal pain. Negative for dysuria and pelvic pain.  Skin: Positive for rash.  Neurological: Negative for headaches.    Allergies  Review of patient's allergies indicates no known allergies.  Home Medications   Current Outpatient Rx  Name Route Sig Dispense Refill  . ACYCLOVIR 400 MG PO TABS Oral Take 1 tablet (400 mg total) by mouth 2 (two) times daily. 60 tablet 11  . GABAPENTIN 300 MG PO CAPS Oral Take 300 mg by mouth 3 (three) times daily.     . INSULIN ASPART 100 UNIT/ML [place] SOLN Subcutaneous Inject 2 Units into the skin 3 (three) times daily before meals.     . INSULIN GLARGINE 100 UNIT/ML [place] SOLN Subcutaneous Inject 14 Units into the skin at bedtime.     Marland Kitchen LISINOPRIL 5 MG PO TABS Oral Take 10 mg by mouth daily.     Marland Kitchen METFORMIN HCL ER 750 MG PO TB24 Oral Take 2 tablets (1,500 mg total) by mouth  daily with supper. 60 tablet 5  . PRAVASTATIN SODIUM 10 MG PO TABS Oral Take 1 tablet (10 mg total) by mouth daily.    . ACYCLOVIR 400 MG PO TABS Oral Take 1 tablet (400 mg total) by mouth 3 (three) times daily. 15 tablet 0  . FLUCONAZOLE 150 MG PO TABS Oral Take 1 tablet (150 mg total) by mouth once. 3 tablet 0    BP 120/79  Pulse 103  Temp(Src) 98.5 F (36.9 C) (Oral)  Resp 18  SpO2 100%  LMP 01/14/2012  Physical Exam  Nursing note and vitals reviewed. Constitutional: She appears well-developed and well-nourished.  Abdominal: Soft.  Genitourinary:    There is erythema and tenderness around the vagina. No bleeding around the vagina. No foreign body around the vagina. No signs of injury around the vagina. Vaginal discharge  found.  Neurological: She is alert.  Skin: Rash noted. There is erythema.    ED Course  Procedures (including critical care time)   Labs Reviewed  WET PREP, GENITAL  GC/CHLAMYDIA PROBE AMP, GENITAL   No results found.   1. Vaginitis and vulvovaginitis   2. Herpes genitalis       MDM      Jimmie Molly, MD 05/03/12 [phone]

## 2012-05-04 LAB — GC/CHLAMYDIA PROBE AMP, GENITAL: GC Probe Amp, Genital: NEGATIVE

## 2012-05-07 ENCOUNTER — Telehealth (HOSPITAL_COMMUNITY): Payer: Self-pay | Admitting: *Deleted

## 2012-05-07 NOTE — ED Notes (Signed)
5/20  GC/Chlamydia neg., Wet prep: Many clue cells, few yeast and few WBC's.  Order obtained from Dr. Ladon Applebaum for Flagyl 500 mg 1 tab po BID x 7 days.  5/21  I called pt.'s number and it was not valid.  I called pt.'s mother and she gave me another number to try [phone]. I called that number and it said she was not available- unable to leave a message. I called pt.'s Mom back and asked her to get a message to her to call me.  Patricia Charles 05/07/2012

## 2012-05-10 ENCOUNTER — Telehealth (HOSPITAL_COMMUNITY): Payer: Self-pay | Admitting: *Deleted

## 2012-05-10 NOTE — ED Notes (Signed)
I called pt. back again. Pt. verified x 2 and given results.  Pt. asked what was being treated and I told her about bacterial vaginosis.  Pt. wants Rx. called to [facility].   Pt. instructed to no alcohol while taking this medication.  Pt. said she does not drink.  Rx. called to pharmacist @ [phone]. Vassie Moselle 05/10/2012

## 2012-05-10 NOTE — ED Notes (Signed)
Pt. called on VM @ 1227 and 1434 saying she was returning the calls about her lab results. I called back but no answer- unable to leave a message.  Letter has been sent to the pt. Patricia Charles 05/10/2012

## 2012-06-26 ENCOUNTER — Encounter: Payer: Self-pay | Admitting: Family Medicine

## 2012-06-26 ENCOUNTER — Emergency Department (HOSPITAL_COMMUNITY): Admission: EM | Admit: 2012-06-26 | Discharge: 2012-06-26 | Payer: Medicaid Other | Source: Home / Self Care

## 2012-06-26 ENCOUNTER — Ambulatory Visit (INDEPENDENT_AMBULATORY_CARE_PROVIDER_SITE_OTHER): Payer: Medicaid Other | Admitting: Family Medicine

## 2012-06-26 VITALS — BP 110/72 | Temp 98.3°F | Ht 71.0 in | Wt 188.8 lb

## 2012-06-26 DIAGNOSIS — E119 Type 2 diabetes mellitus without complications: Secondary | ICD-10-CM

## 2012-06-26 MED ORDER — INSULIN ASPART 100 UNIT/ML ~~LOC~~ SOLN: 2.0000 [IU] | Freq: Three times a day (TID) | SUBCUTANEOUS | Status: DC

## 2012-06-26 NOTE — Assessment & Plan Note (Addendum)
Uncontrolled Diabetes: Hyperglycemia today with POCT glucose above 600. A1c more than 14. Looking back at her chart patient was seen Feb/2013 with similar labs results. It seems that compliance is still an ongoing problem.  She did not have signs of dehydration today, she had normal BP and pulse. No vomiting or other fluid losses that raise more concerns. No signs of infection. Negative neurologic examination. Her A1c is worse than 5 months ago what reinforces the chronicity nature of her compliance. Plan: Recommended to restart insulin regimen as soon as possible. Pt agree to pick Lantus prescription today and will start on 16 units and return to 14 units when Novolog is added as prescribed in the past. (prescription of Novolog sent to pharmacy today since pt stated that she does not have refills left). Red flags discussed with pt, and pt voiced understanding about getting evaluated right away if her condition worsens. Recommended close f/u with PCP.

## 2012-06-26 NOTE — Patient Instructions (Addendum)
Your sugars are very high.  You need to restart your insuline regimen:  Lantus today start on 16 units.  when you get your Novolog the dose will be 2 units 3 times a day with meals, at that point reduce the lantus to 14 units. Continue taking your metformin.  You need to schedule a follow up for this Friday if possible with your PCP. If vomiting or other symptoms appear you need to go to the Emergency Department to get evaluated right away.

## 2012-06-26 NOTE — Progress Notes (Signed)
  Subjective:    Patient ID: Patricia Charles, female    DOB: [birth year], 46 y.o.   MRN: [id number]  HPI Pt that comes today complaining of mild fatigue described as "tiredness". She has DM T2 and is currently on Insulin regimen of Lantus 14 units and NovoLog 2 units 3 times a day before meals as well as Metformin 1500mg  daily. She states has been without medication since Monday of this week due to financial limitations but is able to pick up insulin and metformin today. Pt also has not been compliant with NovoLog for "quite a while" and asked [place] to order refill so she can restart taking it. She started feeling tired today in the morning but has been able to perform her normal activities. She denies LOC, weakness, abdominal or chest pain, palpitations or SOB. She has been taking plenty of fluids and denies vomiting, diarrhea, fever or other symptoms (polyuria, polydipsia)  Review of Systems  Constitutional: Negative for fever and appetite change.  Eyes: Negative for visual disturbance.  Respiratory: Negative.   Cardiovascular: Negative for chest pain and palpitations.  Gastrointestinal: Negative for abdominal distention.  Neurological: Negative for dizziness, syncope, weakness, numbness and headaches.       Objective:   Physical Exam  Constitutional: She is oriented to person, place, and time. She appears well-developed. No distress.  HENT:  Mouth/Throat: Oropharynx is clear and moist.  Eyes: Conjunctivae and EOM are normal. Pupils are equal, round, and reactive to light.  Neck: Normal range of motion. Neck supple.  Cardiovascular: Normal rate, regular rhythm and normal heart sounds.   No murmur heard. Pulmonary/Chest: Effort normal and breath sounds normal.  Abdominal: Soft. Bowel sounds are normal. There is no tenderness.  Musculoskeletal: She exhibits no edema.  Neurological: She is alert and oriented to person, place, and time. She has normal reflexes.       No neurologic focalization.  5/5 strength on all 4 extremities. Normal coordination, normal Romberg test. Normal gait   Skin: Skin is warm and dry.          Assessment & Plan:

## 2012-06-27 LAB — GLUCOSE, CAPILLARY: Glucose-Capillary: >600 mg/dL (ref 70–99)

## 2012-07-31 ENCOUNTER — Telehealth: Payer: Self-pay | Admitting: Family Medicine

## 2012-07-31 NOTE — Telephone Encounter (Signed)
Forward to PCP for letter 

## 2012-07-31 NOTE — Telephone Encounter (Signed)
Called patient she is upset that she can not get a letter, please see message below for more details. She then requested to give over her medical chart and I told her that I cannot do that, she would have to sign a ROI and request her records. Patient states that she will come up here and sign the release.Daion Ginsberg, Rodena Medin

## 2012-07-31 NOTE — Telephone Encounter (Signed)
Patient has only been seen 1 time for diabetes. If patient had been seen multiple times or had hospitalizations, I would be more than happy to write a letter, but she has not.  She can make appt to discuss this, but at this time, I am not comfortable writing a letter. Thanks!

## 2012-07-31 NOTE — Telephone Encounter (Signed)
Patricia Charles states that since her diabetes have been out of control, she has missed quite a bit of school ([employer]). The school has now taken away her financial aid and the only way the she can appeal this would be if Dr. Fara Boros write a letter stating Patricia Charles's condition with her diabetes. If you have any questions as to what the letter needs to say, please call pt at [phone].

## 2012-09-20 ENCOUNTER — Encounter: Payer: Self-pay | Admitting: Family Medicine

## 2012-09-20 ENCOUNTER — Ambulatory Visit (INDEPENDENT_AMBULATORY_CARE_PROVIDER_SITE_OTHER): Payer: Medicaid Other | Admitting: Family Medicine

## 2012-09-20 VITALS — BP 128/89 | HR 93 | Ht 71.0 in | Wt 180.0 lb

## 2012-09-20 DIAGNOSIS — E119 Type 2 diabetes mellitus without complications: Secondary | ICD-10-CM

## 2012-09-20 DIAGNOSIS — E1142 Type 2 diabetes mellitus with diabetic polyneuropathy: Secondary | ICD-10-CM

## 2012-09-20 DIAGNOSIS — E1140 Type 2 diabetes mellitus with diabetic neuropathy, unspecified: Secondary | ICD-10-CM

## 2012-09-20 DIAGNOSIS — I10 Essential (primary) hypertension: Secondary | ICD-10-CM

## 2012-09-20 DIAGNOSIS — E1149 Type 2 diabetes mellitus with other diabetic neurological complication: Secondary | ICD-10-CM

## 2012-09-20 LAB — BASIC METABOLIC PANEL
BUN: 11 mg/dL (ref 6–23)
Creat: 0.91 mg/dL (ref 0.50–1.10)

## 2012-09-20 MED ORDER — LISINOPRIL 5 MG PO TABS: 10.0000 mg | ORAL_TABLET | Freq: Every day | ORAL | Status: DC

## 2012-09-20 MED ORDER — INSULIN ASPART 100 UNIT/ML ~~LOC~~ SOLN: 4.0000 [IU] | Freq: Three times a day (TID) | SUBCUTANEOUS | Status: DC

## 2012-09-20 MED ORDER — GABAPENTIN 300 MG PO CAPS: 300.0000 mg | ORAL_CAPSULE | Freq: Three times a day (TID) | ORAL | Status: DC

## 2012-09-20 MED ORDER — METFORMIN HCL ER 750 MG PO TB24: 1500.0000 mg | ORAL_TABLET | Freq: Every day | ORAL | Status: DC

## 2012-09-20 MED ORDER — INSULIN GLARGINE 100 UNIT/ML ~~LOC~~ SOLN: 18.0000 [IU] | Freq: Every day | SUBCUTANEOUS | Status: DC

## 2012-09-20 NOTE — Assessment & Plan Note (Signed)
Ran out of neurontin, but it was helping.  Rx refilled.

## 2012-09-20 NOTE — Progress Notes (Signed)
S: Pt comes in today for diabetes.  DIABETES Home CBGs: lowest: 115 (fasting), highest 297, average= low 200's  Meds: lantus 14U, novolog 2U TID, metformin 1500 qhs, neurontin 300 TID Taking Meds: yes # of doses missed per week: out of metformin x 1-2 weeks ago; misses 1-2 doses of insulin per week Hypoglycemic episodes?: no Symptoms: Polyuria: no Polydipsia: no Parasthesias: yes   Dizziness: no  Nausea:  no Vomiting:  no Last A1c:  Lab Results  Component Value Date   HGBA1C >14.0 09/20/2012    HYPERTENSION BP: 128/89 Meds: lisino 10 Taking meds: Yes    # of doses missed/week: 2 Symptoms: Headache: No Dizziness: No Vision changes: No SOB:  No Chest pain: No LE swelling: No Tobacco use: Yes-- 2 cig/day    ROS: Per HPI  History  Smoking status  . Former Smoker -- 25 years  . Types: Cigarettes  . Quit date: 12/04/2010  Smokeless tobacco  . Never Used    O:  Filed Vitals:   09/20/12 0939  BP: 128/89  Pulse: 93    Gen: NAD CV: RRR, no murmur Pulm: CTA bilat, no wheezes or crackles Ext: Warm, no chronic skin changes, no edema   A/P: 46 y.o. female p/w DM, HTN, tob use -See problem list -f/u in 1 month

## 2012-09-20 NOTE — Assessment & Plan Note (Addendum)
BP good today. Continue lisinopril. Check BMET-- pt phone # [phone] if anything abnormal.

## 2012-09-20 NOTE — Patient Instructions (Signed)
It was good to see you today.  Let's increase your lantus from 14 to 18 units.   Increase your humalog from 2 to 4 units 3 times a day.  If you can't get your metformin, that's ok, as long as you're doing your insulin every day.   I would like for you to try to get your lisinopril (blood pressure medicine) and keep taking it.   Come back to see me in about 1 month.  Bring a list of your sugars with you.

## 2012-09-20 NOTE — Assessment & Plan Note (Signed)
A1c remains >14; pt reports compliance with insulin (but did not take it this morning); ran out of metformin.  Having difficulties affording all of her medications.  Will increase lantus from 14 to 18U and novolog from 2 TID to 4 TID.  Advised pt that it would be preferable for her to restart metformin, but if financial restraints, would rather have insulin over metformin.

## 2012-09-30 ENCOUNTER — Other Ambulatory Visit: Payer: Self-pay | Admitting: *Deleted

## 2012-09-30 DIAGNOSIS — B009 Herpesviral infection, unspecified: Secondary | ICD-10-CM

## 2012-09-30 MED ORDER — ACYCLOVIR 400 MG PO TABS: 400.0000 mg | ORAL_TABLET | Freq: Two times a day (BID) | ORAL | Status: DC

## 2012-10-24 ENCOUNTER — Encounter: Payer: Self-pay | Admitting: Family Medicine

## 2012-10-24 DIAGNOSIS — R109 Unspecified abdominal pain: Secondary | ICD-10-CM | POA: Insufficient documentation

## 2012-10-30 ENCOUNTER — Encounter: Payer: Self-pay | Admitting: Home Health Services

## 2012-10-30 ENCOUNTER — Inpatient Hospital Stay: Payer: Medicaid Other | Admitting: Family Medicine

## 2012-12-15 ENCOUNTER — Inpatient Hospital Stay (HOSPITAL_COMMUNITY)
Admission: EM | Admit: 2012-12-15 | Discharge: 2012-12-19 | DRG: 638 | Disposition: A | Discharge status: Home / Self Care | Payer: Medicaid Other | Attending: Family Medicine | Admitting: Family Medicine

## 2012-12-15 ENCOUNTER — Emergency Department (HOSPITAL_COMMUNITY): Payer: Medicaid Other

## 2012-12-15 ENCOUNTER — Encounter (HOSPITAL_COMMUNITY): Payer: Self-pay | Admitting: Emergency Medicine

## 2012-12-15 DIAGNOSIS — E1142 Type 2 diabetes mellitus with diabetic polyneuropathy: Secondary | ICD-10-CM | POA: Diagnosis present

## 2012-12-15 DIAGNOSIS — R112 Nausea with vomiting, unspecified: Secondary | ICD-10-CM | POA: Diagnosis present

## 2012-12-15 DIAGNOSIS — I10 Essential (primary) hypertension: Secondary | ICD-10-CM | POA: Diagnosis present

## 2012-12-15 DIAGNOSIS — B351 Tinea unguium: Secondary | ICD-10-CM

## 2012-12-15 DIAGNOSIS — E1049 Type 1 diabetes mellitus with other diabetic neurological complication: Secondary | ICD-10-CM | POA: Diagnosis present

## 2012-12-15 DIAGNOSIS — R079 Chest pain, unspecified: Secondary | ICD-10-CM | POA: Diagnosis present

## 2012-12-15 DIAGNOSIS — M791 Myalgia, unspecified site: Secondary | ICD-10-CM

## 2012-12-15 DIAGNOSIS — E1165 Type 2 diabetes mellitus with hyperglycemia: Secondary | ICD-10-CM | POA: Diagnosis present

## 2012-12-15 DIAGNOSIS — Z79899 Other long term (current) drug therapy: Secondary | ICD-10-CM

## 2012-12-15 DIAGNOSIS — Z01419 Encounter for gynecological examination (general) (routine) without abnormal findings: Secondary | ICD-10-CM

## 2012-12-15 DIAGNOSIS — E1140 Type 2 diabetes mellitus with diabetic neuropathy, unspecified: Secondary | ICD-10-CM

## 2012-12-15 DIAGNOSIS — E875 Hyperkalemia: Secondary | ICD-10-CM | POA: Diagnosis present

## 2012-12-15 DIAGNOSIS — E119 Type 2 diabetes mellitus without complications: Secondary | ICD-10-CM

## 2012-12-15 DIAGNOSIS — E860 Dehydration: Secondary | ICD-10-CM | POA: Diagnosis present

## 2012-12-15 DIAGNOSIS — E876 Hypokalemia: Secondary | ICD-10-CM | POA: Diagnosis not present

## 2012-12-15 DIAGNOSIS — Z794 Long term (current) use of insulin: Secondary | ICD-10-CM

## 2012-12-15 DIAGNOSIS — B009 Herpesviral infection, unspecified: Secondary | ICD-10-CM | POA: Diagnosis present

## 2012-12-15 DIAGNOSIS — E785 Hyperlipidemia, unspecified: Secondary | ICD-10-CM | POA: Diagnosis present

## 2012-12-15 DIAGNOSIS — N179 Acute kidney failure, unspecified: Secondary | ICD-10-CM | POA: Diagnosis present

## 2012-12-15 DIAGNOSIS — IMO0002 Reserved for concepts with insufficient information to code with codable children: Secondary | ICD-10-CM | POA: Diagnosis present

## 2012-12-15 DIAGNOSIS — R109 Unspecified abdominal pain: Secondary | ICD-10-CM | POA: Diagnosis present

## 2012-12-15 DIAGNOSIS — F3289 Other specified depressive episodes: Secondary | ICD-10-CM | POA: Diagnosis present

## 2012-12-15 DIAGNOSIS — E1110 Type 2 diabetes mellitus with ketoacidosis without coma: Secondary | ICD-10-CM | POA: Diagnosis present

## 2012-12-15 DIAGNOSIS — F329 Major depressive disorder, single episode, unspecified: Secondary | ICD-10-CM | POA: Diagnosis present

## 2012-12-15 DIAGNOSIS — E1010 Type 1 diabetes mellitus with ketoacidosis without coma: Principal | ICD-10-CM | POA: Diagnosis present

## 2012-12-15 DIAGNOSIS — F1910 Other psychoactive substance abuse, uncomplicated: Secondary | ICD-10-CM | POA: Diagnosis present

## 2012-12-15 DIAGNOSIS — A6000 Herpesviral infection of urogenital system, unspecified: Secondary | ICD-10-CM | POA: Diagnosis present

## 2012-12-15 DIAGNOSIS — F17200 Nicotine dependence, unspecified, uncomplicated: Secondary | ICD-10-CM | POA: Diagnosis present

## 2012-12-15 DIAGNOSIS — I499 Cardiac arrhythmia, unspecified: Secondary | ICD-10-CM

## 2012-12-15 DIAGNOSIS — F1410 Cocaine abuse, uncomplicated: Secondary | ICD-10-CM | POA: Diagnosis present

## 2012-12-15 DIAGNOSIS — E1065 Type 1 diabetes mellitus with hyperglycemia: Secondary | ICD-10-CM | POA: Diagnosis present

## 2012-12-15 HISTORY — DX: Other psychoactive substance abuse, uncomplicated: F19.10

## 2012-12-15 HISTORY — DX: Anxiety disorder, unspecified: F41.9

## 2012-12-15 HISTORY — DX: Nicotine dependence, unspecified, uncomplicated: F17.200

## 2012-12-15 LAB — POCT I-STAT 3, ART BLOOD GAS (G3+)
Bicarbonate: 6.2 mEq/L — ABNORMAL LOW (ref 20.0–24.0)
TCO2: 7 mmol/L (ref 0–100)
pCO2 arterial: 16.7 mmHg — CL (ref 35.0–45.0)
pH, Arterial: 7.179 — CL (ref 7.350–7.450)
pO2, Arterial: 104 mmHg — ABNORMAL HIGH (ref 80.0–100.0)

## 2012-12-15 LAB — CBC WITH DIFFERENTIAL/PLATELET
Eosinophils Absolute: 0 10*3/uL (ref 0.0–0.7)
Lymphocytes Relative: 13 % (ref 12–46)
Lymphs Abs: 1.8 10*3/uL (ref 0.7–4.0)
Neutrophils Relative %: 82 % — ABNORMAL HIGH (ref 43–77)
Platelets: 269 10*3/uL (ref 150–400)
RBC: 5.57 MIL/uL — ABNORMAL HIGH (ref 3.87–5.11)
WBC: 13.8 10*3/uL — ABNORMAL HIGH (ref 4.0–10.5)

## 2012-12-15 LAB — COMPREHENSIVE METABOLIC PANEL
ALT: 13 U/L (ref 0–35)
Alkaline Phosphatase: 114 U/L (ref 39–117)
CO2: 8 mEq/L — CL (ref 19–32)
GFR calc Af Amer: 46 mL/min — ABNORMAL LOW (ref >90)
Glucose, Bld: 844 mg/dL (ref 70–99)
Potassium: 5.2 mEq/L — ABNORMAL HIGH (ref 3.5–5.1)
Sodium: 134 mEq/L — ABNORMAL LOW (ref 135–145)
Total Protein: 8.7 g/dL — ABNORMAL HIGH (ref 6.0–8.3)

## 2012-12-15 LAB — BASIC METABOLIC PANEL
BUN: 34 mg/dL — ABNORMAL HIGH (ref 6–23)
BUN: 32 mg/dL — ABNORMAL HIGH (ref 6–23)
CO2: <7 mEq/L — CL (ref 19–32)
Calcium: 9.6 mg/dL (ref 8.4–10.5)
Calcium: 9.5 mg/dL (ref 8.4–10.5)
Creatinine, Ser: 1.36 mg/dL — ABNORMAL HIGH (ref 0.50–1.10)
Creatinine, Ser: 1.17 mg/dL — ABNORMAL HIGH (ref 0.50–1.10)
GFR calc non Af Amer: 56 mL/min — ABNORMAL LOW (ref >90)
Glucose, Bld: 586 mg/dL (ref 70–99)
Sodium: 136 mEq/L (ref 135–145)

## 2012-12-15 LAB — POCT I-STAT, CHEM 8
BUN: 43 mg/dL — ABNORMAL HIGH (ref 6–23)
Calcium, Ion: 1.17 mmol/L (ref 1.12–1.23)
Chloride: 101 mEq/L (ref 96–112)
Potassium: 5.4 mEq/L — ABNORMAL HIGH (ref 3.5–5.1)
Sodium: 132 mEq/L — ABNORMAL LOW (ref 135–145)

## 2012-12-15 LAB — RAPID URINE DRUG SCREEN, HOSP PERFORMED
Benzodiazepines: NOT DETECTED
Cocaine: POSITIVE — AB
Opiates: NOT DETECTED

## 2012-12-15 LAB — URINALYSIS, ROUTINE W REFLEX MICROSCOPIC
Bilirubin Urine: NEGATIVE
Ketones, ur: >80 mg/dL — AB
Nitrite: NEGATIVE
pH: 5 (ref 5.0–8.0)

## 2012-12-15 LAB — CBC
MCH: 28.7 pg (ref 26.0–34.0)
MCHC: 32.5 g/dL (ref 30.0–36.0)
RDW: 14.4 % (ref 11.5–15.5)

## 2012-12-15 LAB — POCT PREGNANCY, URINE: Preg Test, Ur: NEGATIVE

## 2012-12-15 LAB — URINE MICROSCOPIC-ADD ON

## 2012-12-15 LAB — ETHANOL: Alcohol, Ethyl (B): <11 mg/dL (ref 0–11)

## 2012-12-15 LAB — MRSA PCR SCREENING: MRSA by PCR: POSITIVE — AB

## 2012-12-15 LAB — LACTIC ACID, PLASMA: Lactic Acid, Venous: 2.4 mmol/L — ABNORMAL HIGH (ref 0.5–2.2)

## 2012-12-15 MED ORDER — SODIUM CHLORIDE 0.9 % IV SOLN
1000.0000 mL | Freq: Once | INTRAVENOUS | Status: AC
  Administered 2012-12-15: 1000 mL via INTRAVENOUS

## 2012-12-15 MED ORDER — GABAPENTIN 300 MG PO CAPS
300.0000 mg | ORAL_CAPSULE | Freq: Three times a day (TID) | ORAL | Status: DC
  Administered 2012-12-16 – 2012-12-19 (×10): 300 mg via ORAL
  Filled 2012-12-15 (×12): qty 1

## 2012-12-15 MED ORDER — SODIUM CHLORIDE 0.9 % IV BOLUS (SEPSIS)
1000.0000 mL | Freq: Once | INTRAVENOUS | Status: AC
  Administered 2012-12-15: 1000 mL via INTRAVENOUS

## 2012-12-15 MED ORDER — SODIUM CHLORIDE 0.9 % IV SOLN: INTRAVENOUS | Status: DC

## 2012-12-15 MED ORDER — SODIUM CHLORIDE 0.9 % IV SOLN
1000.0000 mL | INTRAVENOUS | Status: DC
  Administered 2012-12-15: 1000 mL via INTRAVENOUS

## 2012-12-15 MED ORDER — DEXTROSE 50 % IV SOLN: 25.0000 mL | INTRAVENOUS | Status: DC | PRN

## 2012-12-15 MED ORDER — SODIUM CHLORIDE 0.9 % IV SOLN
1.0000 g | Freq: Once | INTRAVENOUS | Status: AC
  Administered 2012-12-15: 1 g via INTRAVENOUS
  Filled 2012-12-15: qty 10

## 2012-12-15 MED ORDER — ENOXAPARIN SODIUM 40 MG/0.4ML ~~LOC~~ SOLN
40.0000 mg | SUBCUTANEOUS | Status: DC
  Administered 2012-12-15 – 2012-12-18 (×4): 40 mg via SUBCUTANEOUS
  Filled 2012-12-15 (×5): qty 0.4

## 2012-12-15 MED ORDER — DEXTROSE-NACL 5-0.45 % IV SOLN: INTRAVENOUS | Status: DC

## 2012-12-15 MED ORDER — ACYCLOVIR 400 MG PO TABS
400.0000 mg | ORAL_TABLET | Freq: Two times a day (BID) | ORAL | Status: DC
  Filled 2012-12-15 (×2): qty 1

## 2012-12-15 MED ORDER — SODIUM CHLORIDE 0.9 % IV SOLN
INTRAVENOUS | Status: DC
  Administered 2012-12-15: 5.4 [IU]/h via INTRAVENOUS
  Administered 2012-12-16: 4.1 [IU]/h via INTRAVENOUS
  Filled 2012-12-15 (×2): qty 1

## 2012-12-15 MED ORDER — SODIUM CHLORIDE 0.9 % IV SOLN
INTRAVENOUS | Status: AC
  Administered 2012-12-15: 19:00:00 via INTRAVENOUS

## 2012-12-15 MED ORDER — LISINOPRIL 10 MG PO TABS
10.0000 mg | ORAL_TABLET | Freq: Every day | ORAL | Status: DC
  Administered 2012-12-16 – 2012-12-18 (×3): 10 mg via ORAL
  Filled 2012-12-15 (×4): qty 1

## 2012-12-15 NOTE — ED Notes (Signed)
CO2: <7 and glucose: 687 critical result reported to physician

## 2012-12-15 NOTE — ED Provider Notes (Signed)
Medical screening examination/treatment/procedure(s) were conducted as a shared visit with non-physician practitioner(s) and myself.  I personally evaluated the patient during the encounter.  Pt examined.  Noted to be somnolent but arouseable.  She had no meningismus.  Her exam and labs are consistent with severe dehydration and DKA.  Appropriate insulin gtt and IVF initiated.  Pt admitted to the teaching service.  Tobin Chad, MD 12/15/12 2227

## 2012-12-15 NOTE — H&P (Signed)
Family Medicine Teaching [facility] Admission History and Physical Service Pager: [phone]  Patient name: Patricia Charles Medical record number: [id number] Date of birth: Oct 24, 1966 Age: 46 y.o. Gender: female  Primary Care Provider: Demetria Pore, MD  Chief Complaint: nausea and vomiting,   History of Present Illness: Patricia Charles is a 46 y.o. year old female presenting via EMS for nausea nd vomiting for 2 days. She started feeling bad on Friday when she says she ate, used crack, and drank 2 beers plus 2 drinks and started getting nauseous and throwing up. She denies marijuana use, substance use, or drinking EtOH after she got sick on Friday. She has been in bed and throwing up all weekend and unable to take anything PO. Describes emesis as green and red. She denies fever and chills, shortness of breath, cough, skin lesions, abdominal pain, and diarrhea. She states that she for got to take her meds over the weekend but took everything today, including metformin and Lantus.   States that she has been off of drugs for 2 years, Friday was the first time in 2 years that she has used crack. She currently smokes 1 ppd of cigarettes.   ED Course: She was given 3.5 L IV fluids, Calcium gluconate X 2 grams for peaked T waves and elevated potassium, and started on glucomander Insulin Drip.   Patient Active Problem List  Diagnosis  . Diabetes mellitus out of control  . Irregular heart beat  . Herpes  . Myalgia  . Hypertension  . Well woman exam  . Hyperlipidemia  . Diabetic neuropathy, type II diabetes mellitus  . Fungal toenail infection  . Abdominal pain   Past Medical History: Past Medical History  Diagnosis Date  . Diabetes mellitus   . Irregular heart beat   . Depression     Was taking abilify, celexa  . Herpes     Takes acyclovir BID  . Narcotic abuse     Attends NA mtgs  . Hypertension    Past Surgical History: Past Surgical History  Procedure Date  . Tubal ligation      Social History: History  Substance Use Topics  . Smoking status: Former Smoker -- 25 years    Types: Cigarettes    Quit date: 12/04/2010  . Smokeless tobacco: Never Used  . Alcohol Use: No     Comment: drank when she used   For any additional social history documentation, please refer to relevant sections of EMR.  Family History: Family History  Problem Relation Age of Onset  . Heart disease Mother   . Hypertension Mother   . Coronary artery disease Mother    Allergies: No Known Allergies Current Facility-Administered Medications on File Prior to Encounter  Medication Dose Route Frequency Provider Last Rate Last Dose  . TDaP (BOOSTRIX) injection 0.5 mL  0.5 mL Intramuscular Once Tito Dine, MD       Current Outpatient Prescriptions on File Prior to Encounter  Medication Sig Dispense Refill  . acyclovir (ZOVIRAX) 400 MG tablet Take 1 tablet (400 mg total) by mouth 2 (two) times daily.  60 tablet  11  . gabapentin (NEURONTIN) 300 MG capsule Take 1 capsule (300 mg total) by mouth 3 (three) times daily.  90 capsule  5  . insulin aspart (NOVOLOG) 100 UNIT/ML injection Inject 4 Units into the skin 3 (three) times daily before meals.  1 vial  1  . insulin glargine (LANTUS) 100 UNIT/ML injection Inject 18 Units into the skin at bedtime.  10 mL  5  . lisinopril (PRINIVIL,ZESTRIL) 5 MG tablet Take 2 tablets (10 mg total) by mouth daily.  60 tablet  5  . metFORMIN (GLUCOPHAGE-XR) 750 MG 24 hr tablet Take 2 tablets (1,500 mg total) by mouth daily with supper.  60 tablet  5  . pravastatin (PRAVACHOL) 10 MG tablet Take 1 tablet (10 mg total) by mouth daily.      Marland Kitchen acyclovir (ZOVIRAX) 400 MG tablet Take 1 tablet (400 mg total) by mouth 3 (three) times daily.  15 tablet  0   Review Of Systems: Per HPI with the following additions. Otherwise 12 point review of systems was performed and was unremarkable.  Physical Exam: BP 123/77  Pulse 116  Temp 97.5 F (36.4 C) (Oral)  Resp 24   SpO2 100%  LMP 01/14/2012 Exam: Gen: moderate distress, visibly ill female with a EM-bag wretching while we were beginning the exam. Alert, cooperative with exam HEENT: NCAT, EOMI, PERRL, Sticky mucous membranes CV: RRR, good S1/S2, no murmur Resp: CTABL, no wheezes, non-labored Abd: SNTND, BS present, no guarding or organomegaly Ext: No edema, warm, 2+ DP pulses Neuro: Alert and oriented, Strength 5/5 in BL LE Skin: No visible lesions on BL feet and legs.  Labs and Imaging: CBC BMET   Lab 12/15/12 1705 12/15/12 1634  WBC -- 13.8*  HGB 19.0* --  HCT 56.0* --  PLT -- 269    Lab 12/15/12 1705 12/15/12 1634  NA 132* --  K 5.4* --  CL 101 --  CO2 -- 8*  BUN 43* --  CREATININE 1.80* --  GLUCOSE >700* --  CALCIUM -- 10.0    UA-  > 100 glucose, > 80 Ketones, neg nitrites and LE, trace Hgb  Urine Pregnancy test negative  CXR 12/15/2012 IMPRESSION:  No active cardiopulmonary disease.  Assessment and Plan: Patricia Charles is a 46 y.o. year old female presenting with Nausea and vomiting for 2 days starting after a drug relapse, found in the ED to have a blood glucose level of 844.   1. DKA - With her HCO3 of 8, anion Gap of 45, and Glucose of 844 it seems that DKA is the most likely etiology of her current presentation. We considered other causes although as she was so willing to admit her recent crak use we felt hat she is probably being truthful about no other ingestions as well. We also considered HHONK but with an extremely low HCO3 this is unlikely.  - Admit to step-down - No clear source insult to spur DKA. I think it is possible that the crack either made her nauseous or was possibly tainted causing nausea which when she did not take her meds led to her current state.  - Will check ABG and Lactic Acid to further determine acidotic status. - Glucomander insulin drip - 2 More liters bolus and then 150 mL/hr each of NS  - BMP Q2 hours per protocol - Hold metformin while IP,  Plan to restart Lantus and home dose of novolog when gap closes and CBGs normalize, 2 hours before stopping glucomander.   2. Substance Abuse - has been attending NA meeting for 2 years, this was her first relapse - Encourage to return to her meetings on dc - UDS, EtOH level  3. Emesis - Described as green and red, possibly mallory weiss tears - considering her very high Hgb of 19.1 (recognizing that it is concentrated) we feel that it is very unlikely that she has an  acute bleed. And without abdominal pain to deep palpation obstruction or other acute abdominal process seems less likely to explain the bile.  - monitor and follow up with nursing if she continues to vomit.   4. Hyperkalemia - Elevated at 5.4 today,  - EKG showed peaked T waves in the ED so they ordered calcium gluconate times 2 grams - Continuous telemetry - Will monitor for decrease as she receives her insulin.  - EKG am  5. Acute Kidney injury - Likely due to her current very dehydrated state.  - Will re-hydrate as above and monitor with BMP - Hold lisinopril, will restart in the am if Cre has significantly improved. - Hold metformin.  6. HTN - Normotensive in the ED today, took her lisinopril today - hold lisinopril as above, next dose due in the Am   7. HLD - Held pravastatin as it is not formulary, will consider simvastatin or atorvastatin temporarily but will re-start pravastatin on dc.   8. Neuropathy - Hold gabapentin tonight, restart in the Am  9. Genital herpes  - Hold tonight's dose of acyclovir but restart per home dose in the Am.   FEN/GI: NPO until CBGs normalize, IV NS per usual DKA protocol. Prophylaxis: Lovenox Disposition: Home pending normalization of CBGs and further assessment Code Status: Full for now, will clarify  Kevin Fenton, MD 12/15/2012, 6:08 PM  Teaching Service Addendum. I have seen and evaluated this pt in conjunction with Dr. Ermalinda Memos. I agree with H&P and assessment  and plan as described in this note.  D. [place] Rolene Arbour, MD Family Medicine  PGY-2

## 2012-12-15 NOTE — ED Provider Notes (Signed)
History     CSN: [id number]  Arrival date & time 12/15/12  1617   First MD Initiated Contact with Patient 12/15/12 1618      Chief Complaint  Patient presents with  . Nausea  . Vomiting    (Consider location/radiation/quality/duration/timing/severity/associated sxs/prior treatment) HPI Comments: Patient with history of poorly controlled insulin-dependent diabetes (A1c>14 in 07/13), polysubstance abuse -- presents with 2 days of persistent vomiting. Patient states that the comment is "green and red". She has not been to eat or drink anything for the past 2 days and states that she has not been taking her blood sugar medications (insulin and metformin). She denies fever or cold symptoms. No cough or shortness of breath. No abdominal pain. No changes in urination or dysuria. No skin rashes. Onset acute. Course is gradually worsening. No treatments PTA. Nothing makes sx better or worse.   The history is provided by the patient.    Past Medical History  Diagnosis Date  . Diabetes mellitus   . Irregular heart beat   . Depression     Was taking abilify, celexa  . Herpes     Takes acyclovir BID  . Narcotic abuse     Attends NA mtgs  . Hypertension     Past Surgical History  Procedure Date  . Tubal ligation     Family History  Problem Relation Age of Onset  . Heart disease Mother   . Hypertension Mother   . Coronary artery disease Mother     History  Substance Use Topics  . Smoking status: Former Smoker -- 25 years    Types: Cigarettes    Quit date: 12/04/2010  . Smokeless tobacco: Never Used  . Alcohol Use: No     Comment: drank when she used    OB History    Grav Para Term Preterm Abortions TAB SAB Ect Mult Living   4 2 0  2 1 1   2       Review of Systems  Constitutional: Negative for fever.  HENT: Negative for sore throat and rhinorrhea.   Eyes: Negative for redness.  Respiratory: Negative for cough.   Cardiovascular: Negative for chest pain.    Gastrointestinal: Positive for nausea and vomiting. Negative for abdominal pain and diarrhea.  Genitourinary: Negative for dysuria and difficulty urinating.  Musculoskeletal: Negative for myalgias.  Skin: Negative for rash.  Neurological: Negative for headaches.    Allergies  Review of patient's allergies indicates no known allergies.  Home Medications   Current Outpatient Rx  Name  Route  Sig  Dispense  Refill  . ACYCLOVIR 400 MG PO TABS   Oral   Take 1 tablet (400 mg total) by mouth 3 (three) times daily.   15 tablet   0   . ACYCLOVIR 400 MG PO TABS   Oral   Take 1 tablet (400 mg total) by mouth 2 (two) times daily.   60 tablet   11   . GABAPENTIN 300 MG PO CAPS   Oral   Take 1 capsule (300 mg total) by mouth 3 (three) times daily.   90 capsule   5   . INSULIN ASPART 100 UNIT/ML [place] SOLN   Subcutaneous   Inject 4 Units into the skin 3 (three) times daily before meals.   1 vial   1   . INSULIN GLARGINE 100 UNIT/ML  SOLN   Subcutaneous   Inject 18 Units into the skin at bedtime.   10 mL   5   .  LISINOPRIL 5 MG PO TABS   Oral   Take 2 tablets (10 mg total) by mouth daily.   60 tablet   5   . METFORMIN HCL ER 750 MG PO TB24   Oral   Take 2 tablets (1,500 mg total) by mouth daily with supper.   60 tablet   5   . PRAVASTATIN SODIUM 10 MG PO TABS   Oral   Take 1 tablet (10 mg total) by mouth daily.           BP 124/80  Pulse 84  Temp 97.5 F (36.4 C) (Oral)  SpO2 100%  LMP 01/14/2012  Physical Exam  Nursing note and vitals reviewed. Constitutional: She appears well-developed and well-nourished.  HENT:  Head: Normocephalic and atraumatic.  Eyes: Conjunctivae normal are normal. Right eye exhibits no discharge. Left eye exhibits no discharge.  Neck: Normal range of motion. Neck supple.  Cardiovascular: Regular rhythm, normal heart sounds and normal pulses.  Tachycardia present.   No murmur heard. Pulmonary/Chest: Breath sounds normal.  Tachypnea (mild) noted. She has no decreased breath sounds. She has no wheezes. She has no rhonchi.  Abdominal: Soft. Bowel sounds are decreased. There is tenderness (mild) in the suprapubic area. There is no rebound and no guarding.  Musculoskeletal: She exhibits no edema and no tenderness.       Cool extremities. Normal cap refill.   Neurological: She is alert.  Skin: Skin is warm and dry.  Psychiatric: She has a normal mood and affect.    ED Course  Procedures (including critical care time)  Labs Reviewed  CBC WITH DIFFERENTIAL - Abnormal; Notable for the following:    WBC 13.8 (*)     RBC 5.57 (*)     Hemoglobin 16.1 (*)     HCT 50.1 (*)     Neutrophils Relative 82 (*)     Neutro Abs 11.3 (*)     All other components within normal limits  COMPREHENSIVE METABOLIC PANEL - Abnormal; Notable for the following:    Sodium 134 (*)     Potassium 5.2 (*)     Chloride 81 (*)     CO2 8 (*)     Glucose, Bld 844 (*)     BUN 38 (*)     Creatinine, Ser 1.56 (*)     Total Protein 8.7 (*)     GFR calc non Af Amer 39 (*)     GFR calc Af Amer 46 (*)     All other components within normal limits  URINALYSIS, ROUTINE W REFLEX MICROSCOPIC - Abnormal; Notable for the following:    Glucose, UA >1000 (*)     Hgb urine dipstick TRACE (*)     Ketones, ur >80 (*)     All other components within normal limits  GLUCOSE, CAPILLARY - Abnormal; Notable for the following:    Glucose-Capillary >600 (*)     All other components within normal limits  POCT I-STAT, CHEM 8 - Abnormal; Notable for the following:    Sodium 132 (*)     Potassium 5.4 (*)     BUN 43 (*)     Creatinine, Ser 1.80 (*)     Glucose, Bld >700 (*)     Hemoglobin 19.0 (*)     HCT 56.0 (*)     All other components within normal limits  LIPASE, BLOOD  POCT PREGNANCY, URINE  URINE MICROSCOPIC-ADD ON   Dg Chest Port 1 View  12/15/2012  *RADIOLOGY  REPORT*  Clinical Data: Nausea, vomiting.  PORTABLE CHEST - 1 VIEW  Comparison: None   Findings: Heart and mediastinal contours are within normal limits. No focal opacities or effusions.  No acute bony abnormality.  IMPRESSION: No active cardiopulmonary disease.   Original Report Authenticated By: Charlett Nose, M.D.      1. DKA (diabetic ketoacidoses)     4:33 PM Patient seen and examined. Fluid ordered. CBG offscale high per EMS. Work-up initiated.   Vital signs reviewed and are as follows: Filed Vitals:   12/15/12 1623  BP: 124/80  Pulse: 84  Temp: 97.5 F (36.4 C)   5:13 PM Elevated anion gap (21). Will treat as DKA. Protocol started.   5:26 PM Re-exam: patient appears comfortable, no active vomiting, more profound tachycardia (120's). Awaiting EKG. Will admit.   5:37 PM EKG with peaked t-waves. K+ just slightly elevated on istat. Awaiting lab K+. EKG reviewed with Dr. Lorenso Courier. Will give 2g calcium gluconate and insulin to treat. Also starting 2nd line.    Date: 12/15/2012  Rate: 125  Rhythm: sinus tachycardia  QRS Axis: normal  Intervals: normal  ST/T Wave abnormalities: peaked t-waves  Conduction Disutrbances:none  Narrative Interpretation:   Old EKG Reviewed: none available  5:51 PM FPC to see. Insulin is hanging.   6:14 PM Re-exam: stable. Patient has oral swab. Hemodynamically stable.   CRITICAL CARE Performed by: Carolee Rota  Total critical care time: 40 mins  Critical care time was exclusive of separately billable procedures and treating other patients.  Critical care was necessary to treat or prevent imminent or life-threatening deterioration.  Critical care was time spent personally by me on the following activities: development of treatment plan with patient and/or surrogate as well as nursing, discussions with consultants, evaluation of patient's response to treatment, examination of patient, obtaining history from patient or surrogate, ordering and performing treatments and interventions, ordering and review of laboratory studies,  ordering and review of radiographic studies, pulse oximetry and re-evaluation of patient's condition.   MDM  DKA, gap 45. Hyperkalemia with peaked t-waves on EKG, calcium given for this. Insulin administration and fluids given. No definitive infectious stressor identified. Patient does have a history of non-compliacance with her medications.        Renne Crigler, [place] 12/15/12 1818

## 2012-12-15 NOTE — Progress Notes (Signed)
Md notified of Potassium 6.3.  ABG Ph 7.17, Pco2 16.8, Po2 104, Hco3 6.2.  HR 120's nausea with vomiting.  New orders received.  Will give NS 1 liter bolus now and continue to monitor. Emilie [place] Liter

## 2012-12-15 NOTE — ED Notes (Signed)
Renne Crigler PA informed of chem8 lab results. ED-Lab.

## 2012-12-15 NOTE — ED Notes (Signed)
Received pt from home via EMS with c/o N/V onset Friday. Pt took insulin today but Friday or Saturday. CBG for EMS read High. Pt c/o abdominal soreness.

## 2012-12-16 LAB — CBC
HCT: 43.2 % (ref 36.0–46.0)
Hemoglobin: 15.1 g/dL — ABNORMAL HIGH (ref 12.0–15.0)
WBC: 15.5 10*3/uL — ABNORMAL HIGH (ref 4.0–10.5)

## 2012-12-16 LAB — GLUCOSE, CAPILLARY
Glucose-Capillary: 134 mg/dL — ABNORMAL HIGH (ref 70–99)
Glucose-Capillary: 146 mg/dL — ABNORMAL HIGH (ref 70–99)
Glucose-Capillary: 150 mg/dL — ABNORMAL HIGH (ref 70–99)
Glucose-Capillary: 230 mg/dL — ABNORMAL HIGH (ref 70–99)
Glucose-Capillary: 141 mg/dL — ABNORMAL HIGH (ref 70–99)
Glucose-Capillary: 159 mg/dL — ABNORMAL HIGH (ref 70–99)
Glucose-Capillary: 161 mg/dL — ABNORMAL HIGH (ref 70–99)
Glucose-Capillary: 126 mg/dL — ABNORMAL HIGH (ref 70–99)
Glucose-Capillary: 139 mg/dL — ABNORMAL HIGH (ref 70–99)
Glucose-Capillary: 190 mg/dL — ABNORMAL HIGH (ref 70–99)
Glucose-Capillary: 239 mg/dL — ABNORMAL HIGH (ref 70–99)

## 2012-12-16 LAB — BASIC METABOLIC PANEL
BUN: 27 mg/dL — ABNORMAL HIGH (ref 6–23)
BUN: 22 mg/dL (ref 6–23)
BUN: 20 mg/dL (ref 6–23)
CO2: 18 mEq/L — ABNORMAL LOW (ref 19–32)
CO2: 18 mEq/L — ABNORMAL LOW (ref 19–32)
CO2: 22 mEq/L (ref 19–32)
Calcium: 8.8 mg/dL (ref 8.4–10.5)
Calcium: 8.9 mg/dL (ref 8.4–10.5)
Calcium: 8.8 mg/dL (ref 8.4–10.5)
Calcium: 8.9 mg/dL (ref 8.4–10.5)
Chloride: 110 mEq/L (ref 96–112)
Chloride: 109 mEq/L (ref 96–112)
Creatinine, Ser: 1.06 mg/dL (ref 0.50–1.10)
Creatinine, Ser: 0.94 mg/dL (ref 0.50–1.10)
GFR calc Af Amer: 73 mL/min — ABNORMAL LOW (ref >90)
GFR calc Af Amer: >90 mL/min (ref >90)
GFR calc Af Amer: >90 mL/min (ref >90)
GFR calc Af Amer: >90 mL/min (ref >90)
GFR calc Af Amer: >90 mL/min (ref >90)
GFR calc non Af Amer: 63 mL/min — ABNORMAL LOW (ref >90)
GFR calc non Af Amer: 83 mL/min — ABNORMAL LOW (ref >90)
GFR calc non Af Amer: >90 mL/min (ref >90)
GFR calc non Af Amer: >90 mL/min (ref >90)
GFR calc non Af Amer: >90 mL/min (ref >90)
Glucose, Bld: 160 mg/dL — ABNORMAL HIGH (ref 70–99)
Glucose, Bld: 179 mg/dL — ABNORMAL HIGH (ref 70–99)
Glucose, Bld: 275 mg/dL — ABNORMAL HIGH (ref 70–99)
Potassium: 3.7 mEq/L (ref 3.5–5.1)
Potassium: 4.4 mEq/L (ref 3.5–5.1)
Potassium: 3.7 mEq/L (ref 3.5–5.1)
Potassium: 3.5 mEq/L (ref 3.5–5.1)
Sodium: 142 mEq/L (ref 135–145)
Sodium: 140 mEq/L (ref 135–145)
Sodium: 133 mEq/L — ABNORMAL LOW (ref 135–145)

## 2012-12-16 LAB — TROPONIN I: Troponin I: <0.3 ng/mL (ref <0.30)

## 2012-12-16 MED ORDER — INSULIN GLARGINE 100 UNIT/ML ~~LOC~~ SOLN: 15.0000 [IU] | Freq: Every day | SUBCUTANEOUS | Status: DC

## 2012-12-16 MED ORDER — MORPHINE SULFATE 2 MG/ML IJ SOLN: 2.0000 mg | INTRAMUSCULAR | Status: DC

## 2012-12-16 MED ORDER — SODIUM CHLORIDE 0.9 % IV BOLUS (SEPSIS)
1000.0000 mL | Freq: Once | INTRAVENOUS | Status: AC
  Administered 2012-12-16: 1000 mL via INTRAVENOUS

## 2012-12-16 MED ORDER — INSULIN ASPART 100 UNIT/ML ~~LOC~~ SOLN
0.0000 [IU] | Freq: Every day | SUBCUTANEOUS | Status: DC
  Administered 2012-12-16: 4 [IU] via SUBCUTANEOUS

## 2012-12-16 MED ORDER — MUPIROCIN 2 % EX OINT
1.0000 "application " | TOPICAL_OINTMENT | Freq: Two times a day (BID) | CUTANEOUS | Status: DC
  Administered 2012-12-16 – 2012-12-19 (×7): 1 via NASAL
  Filled 2012-12-16 (×2): qty 22

## 2012-12-16 MED ORDER — INSULIN ASPART 100 UNIT/ML ~~LOC~~ SOLN
0.0000 [IU] | Freq: Three times a day (TID) | SUBCUTANEOUS | Status: DC
  Administered 2012-12-16: 3 [IU] via SUBCUTANEOUS

## 2012-12-16 MED ORDER — INSULIN ASPART 100 UNIT/ML ~~LOC~~ SOLN
0.0000 [IU] | Freq: Three times a day (TID) | SUBCUTANEOUS | Status: DC
  Administered 2012-12-17 (×2): 5 [IU] via SUBCUTANEOUS
  Administered 2012-12-17 – 2012-12-18 (×2): 3 [IU] via SUBCUTANEOUS
  Administered 2012-12-18: 2 [IU] via SUBCUTANEOUS
  Administered 2012-12-18 – 2012-12-19 (×3): 3 [IU] via SUBCUTANEOUS

## 2012-12-16 MED ORDER — SODIUM CHLORIDE 0.9 % IV SOLN
INTRAVENOUS | Status: DC
  Administered 2012-12-17: 22:00:00 via INTRAVENOUS
  Administered 2012-12-17: 150 mL/h via INTRAVENOUS
  Administered 2012-12-17 – 2012-12-18 (×2): via INTRAVENOUS

## 2012-12-16 MED ORDER — INSULIN ASPART 100 UNIT/ML ~~LOC~~ SOLN: 0.0000 [IU] | Freq: Every day | SUBCUTANEOUS | Status: DC

## 2012-12-16 MED ORDER — FAMOTIDINE 20 MG PO TABS
20.0000 mg | ORAL_TABLET | Freq: Every day | ORAL | Status: DC
Start: 2012-12-16 — End: 2012-12-17
  Administered 2012-12-16: 20 mg via ORAL
  Filled 2012-12-16 (×2): qty 1

## 2012-12-16 MED ORDER — INSULIN GLARGINE 100 UNIT/ML ~~LOC~~ SOLN
15.0000 [IU] | Freq: Every day | SUBCUTANEOUS | Status: DC
  Administered 2012-12-16: 15 [IU] via SUBCUTANEOUS
  Administered 2012-12-17: 22:00:00 via SUBCUTANEOUS
  Administered 2012-12-18: 15 [IU] via SUBCUTANEOUS

## 2012-12-16 MED ORDER — ACYCLOVIR 200 MG PO CAPS
400.0000 mg | ORAL_CAPSULE | Freq: Two times a day (BID) | ORAL | Status: DC
  Administered 2012-12-16 – 2012-12-19 (×7): 400 mg via ORAL
  Filled 2012-12-16 (×9): qty 2

## 2012-12-16 MED ORDER — INSULIN ASPART 100 UNIT/ML ~~LOC~~ SOLN
4.0000 [IU] | Freq: Three times a day (TID) | SUBCUTANEOUS | Status: DC
  Administered 2012-12-18 – 2012-12-19 (×5): 4 [IU] via SUBCUTANEOUS

## 2012-12-16 MED ORDER — PANTOPRAZOLE SODIUM 40 MG PO TBEC
40.0000 mg | DELAYED_RELEASE_TABLET | Freq: Every day | ORAL | Status: DC
  Administered 2012-12-16: 40 mg via ORAL
  Filled 2012-12-16: qty 1

## 2012-12-16 MED ORDER — GI COCKTAIL ~~LOC~~
30.0000 mL | Freq: Once | ORAL | Status: AC
  Administered 2012-12-16: 30 mL via ORAL
  Filled 2012-12-16: qty 30

## 2012-12-16 MED ORDER — CALCIUM CARBONATE ANTACID 500 MG PO CHEW
1.0000 | CHEWABLE_TABLET | ORAL | Status: AC
  Administered 2012-12-16: 200 mg via ORAL
  Filled 2012-12-16: qty 1

## 2012-12-16 MED ORDER — DEXTROSE-NACL 5-0.45 % IV SOLN
INTRAVENOUS | Status: DC
  Administered 2012-12-16: 150 mL via INTRAVENOUS
  Administered 2012-12-16: 150 mL/h via INTRAVENOUS

## 2012-12-16 MED ORDER — CHLORHEXIDINE GLUCONATE CLOTH 2 % EX PADS
6.0000 | MEDICATED_PAD | Freq: Every day | CUTANEOUS | Status: DC
  Administered 2012-12-16 – 2012-12-19 (×4): 6 via TOPICAL

## 2012-12-16 NOTE — Progress Notes (Signed)
Notified by lab of MRSA Positive.  Placed pt on contact new orders entered. Will continue to monitor. Emilie [place] Liter

## 2012-12-16 NOTE — Progress Notes (Signed)
Family Medicine Teaching Service Daily Progress Note Service Page: [phone]  Subjective: Patient complains of chest discomfort/"indigestion" that started this morning.  She denies any nausea/vomiting since yesterday.  Patient says she is very thirsty and asking for water.  Patient denies any pain other than indigestion at this time.    Objective: Temp:  [97.5 F (36.4 C)-98.4 F (36.9 C)] 98.1 F (36.7 C) (12/30 0829) Pulse Rate:  [84-124] 109  (12/30 0800) Resp:  [16-31] 22  (12/30 0800) BP: (94-151)/(72-117) 132/117 mmHg (12/30 0800) SpO2:  [99 %-100 %] 100 % (12/30 0800) Weight:  [162 lb 4.1 oz (73.6 kg)] 162 lb 4.1 oz (73.6 kg) (12/29 2112) Exam: General: appears uncomfortable, but awake, alert, cooperative Cardiovascular: sinus tachycardia, no murmur Respiratory: clear bilaterally without wheezes, rales, rhonchi Abdomen: soft, NT, ND, + BS Extremities: No C/C/E  I have reviewed the patient's medications, labs, imaging, and diagnostic testing.  Notable results are summarized below.  CBC BMET   Lab 12/15/12 1846 12/15/12 1705 12/15/12 1634  WBC 16.3* -- 13.8*  HGB 14.9 19.0* 16.1*  HCT 45.8 56.0* 50.1*  PLT 266 -- 269    Lab 12/16/12 0600 12/16/12 0239 12/15/12 2228  NA 141 143 143  K 3.7 4.0 3.7  CL 110 111 106  CO2 18* 18* 16*  BUN 22 24* 27*  CREATININE 0.84 0.94 1.06  GLUCOSE 179* 160* 253*  CALCIUM 8.8 8.9 8.8      UA- > 100 glucose, > 80 Ketones, neg nitrites and LE, trace Hgb  Urine Pregnancy test negative   CXR 12/15/2012  IMPRESSION:  No active cardiopulmonary disease.   Assessment and Plan:  Patricia Charles is a 46 y.o. year old female presenting with hyperglycemia, nausea/vomiting, and elevated anion gap concerning for DKA.   1. DKA: No clear source or insult to trigger DK, possibly recent use of cocaine either made her nauseous or was possibly tainted and patient unable to take medications - Acidosis resolving, now 18; Anion gap 13, and CBG  improving status post 4 liters IVF and gluco stabilizer - Step-down status until able to transition off gluco stabilizer - Continue BMET Q2 hours - Will check ABG and Lactic Acid to further determine acidotic status.  - Hold metformin while IP, Plan to restart Lantus and home dose of novolog when gap closes and CBGs normalize, 2 hours before stopping glucomander.   2. Chest discomfort: likely secondary to N/V, but due to recent cocaine use, will rule ACS with EKG now and Troponins - Will give Tums now, start Protonix daily - Follow up CE x 3  3. Substance Abuse: has been attending rehab meetings for 2 years, this was her first relapse; Ethanol WNL - Encourage to return to her meetings on dc  - UDS pending   4. Nausea/Emesis: Resolving; last vomited 300 cc last night, brown color - Last night there was concern for mallory weiss tears, most recent Hgb stable 14.9 - Repeat CBC today - Will continue to monitor closely  4. Hyperkalemia: improved 3.7. Today. - Continuous telemetry  - Serial BMET  5. Acute Kidney injury: Likely due to her current very dehydrated state.  - Will re-hydrate as above and monitor with BMP  - Hold lisinopril, will restart in the am if Cre has significantly improved - Hold metformin  6. HTN: BP at goal - Hold lisinopril as above, next dose due in the AM  7. HLD  - Held pravastatin as it is not formulary, will consider simvastatin  or atorvastatin temporarily but will re-start pravastatin on dc  8. Neuropathy  - Hold gabapentin tonight, restart when able to tolerate PO  9. Genital herpes  - Hold tonight's dose of acyclovir but restart per home dose when taking PO  FEN/GI: IV NS per usual DKA protocol.  Advance to clear liquids.  Prophylaxis: Lovenox  Disposition: Home pending normalization of CBGs and further assessment  Code Status: Full   DE LA CRUZ,IVY, DO 12/16/2012, 9:24 AM

## 2012-12-16 NOTE — Progress Notes (Signed)
FMTS Attending Note Patient's case discussed with resident team, and I agree with plan as documented in note. Plan to transfer to subcutaneous basal insulin and monitor glucose, chemistry profile.  Paula Compton, MD

## 2012-12-16 NOTE — H&P (Signed)
Family Medicine Teaching Service Attending Note  I interviewed and examined patient Patricia Charles and reviewed their tests and x-rays.  I discussed with Dr. Aviva Signs and reviewed their note for today.  I agree with their assessment and plan.     Additionally  This am is alert and complains of indigestion/abd pain Abdomen - soft no guarding rebouind mild epig tenderness Acidosis with Dehydration and Hyperglycemia perhaps triggered by viral gastroenteritis and or drug use.   Would give more fluids as still appears dry.  Transition to home regimen of diabetes Offer rehab/counseling for substance abuse

## 2012-12-17 ENCOUNTER — Encounter (HOSPITAL_COMMUNITY): Payer: Self-pay | Admitting: *Deleted

## 2012-12-17 LAB — GLUCOSE, CAPILLARY
Glucose-Capillary: 222 mg/dL — ABNORMAL HIGH (ref 70–99)
Glucose-Capillary: 206 mg/dL — ABNORMAL HIGH (ref 70–99)
Glucose-Capillary: 165 mg/dL — ABNORMAL HIGH (ref 70–99)

## 2012-12-17 LAB — BASIC METABOLIC PANEL
BUN: 7 mg/dL (ref 6–23)
CO2: 19 mEq/L (ref 19–32)
CO2: 21 mEq/L (ref 19–32)
CO2: 23 mEq/L (ref 19–32)
Calcium: 8.6 mg/dL (ref 8.4–10.5)
Chloride: 101 mEq/L (ref 96–112)
Chloride: 103 mEq/L (ref 96–112)
Creatinine, Ser: 0.5 mg/dL (ref 0.50–1.10)
GFR calc Af Amer: >90 mL/min (ref >90)
GFR calc non Af Amer: >90 mL/min (ref >90)
Glucose, Bld: 229 mg/dL — ABNORMAL HIGH (ref 70–99)
Glucose, Bld: 289 mg/dL — ABNORMAL HIGH (ref 70–99)
Potassium: 3.3 mEq/L — ABNORMAL LOW (ref 3.5–5.1)
Potassium: 3.4 mEq/L — ABNORMAL LOW (ref 3.5–5.1)
Sodium: 135 mEq/L (ref 135–145)
Sodium: 135 mEq/L (ref 135–145)

## 2012-12-17 LAB — TROPONIN I: Troponin I: <0.3 ng/mL (ref <0.30)

## 2012-12-17 LAB — HEMOGLOBIN A1C: Mean Plasma Glucose: 407 mg/dL — ABNORMAL HIGH (ref <117)

## 2012-12-17 LAB — CBC
MCH: 29 pg (ref 26.0–34.0)
Platelets: 216 10*3/uL (ref 150–400)
RBC: 4.66 MIL/uL (ref 3.87–5.11)

## 2012-12-17 MED ORDER — GI COCKTAIL ~~LOC~~
30.0000 mL | Freq: Once | ORAL | Status: AC
  Administered 2012-12-17: 30 mL via ORAL
  Filled 2012-12-17: qty 30

## 2012-12-17 MED ORDER — SODIUM CHLORIDE 0.9 % IV BOLUS (SEPSIS)
1000.0000 mL | Freq: Once | INTRAVENOUS | Status: AC
  Administered 2012-12-17: 1000 mL via INTRAVENOUS

## 2012-12-17 MED ORDER — SODIUM CHLORIDE 0.9 % IV BOLUS (SEPSIS)
500.0000 mL | Freq: Once | INTRAVENOUS | Status: AC
  Administered 2012-12-17: 500 mL via INTRAVENOUS

## 2012-12-17 MED ORDER — ACETAMINOPHEN 325 MG PO TABS
650.0000 mg | ORAL_TABLET | Freq: Four times a day (QID) | ORAL | Status: DC | PRN
  Administered 2012-12-17 – 2012-12-19 (×5): 650 mg via ORAL
  Filled 2012-12-17 (×5): qty 2

## 2012-12-17 MED ORDER — POTASSIUM CHLORIDE CRYS ER 20 MEQ PO TBCR
40.0000 meq | EXTENDED_RELEASE_TABLET | Freq: Two times a day (BID) | ORAL | Status: DC
  Administered 2012-12-17 – 2012-12-18 (×4): 40 meq via ORAL
  Filled 2012-12-17 (×6): qty 2

## 2012-12-17 MED ORDER — PANTOPRAZOLE SODIUM 40 MG PO TBEC
40.0000 mg | DELAYED_RELEASE_TABLET | Freq: Every day | ORAL | Status: DC
  Administered 2012-12-17 – 2012-12-19 (×3): 40 mg via ORAL
  Filled 2012-12-17 (×2): qty 1
  Filled 2012-12-17: qty 2

## 2012-12-17 MED ORDER — INSULIN GLARGINE 100 UNIT/ML ~~LOC~~ SOLN
18.0000 [IU] | Freq: Every day | SUBCUTANEOUS | Status: DC
  Administered 2012-12-18: 18 [IU] via SUBCUTANEOUS

## 2012-12-17 MED ORDER — SUCRALFATE 1 G PO TABS
1.0000 g | ORAL_TABLET | Freq: Three times a day (TID) | ORAL | Status: DC
  Administered 2012-12-17 – 2012-12-19 (×8): 1 g via ORAL
  Filled 2012-12-17 (×13): qty 1

## 2012-12-17 NOTE — Progress Notes (Signed)
Family Medicine Teaching Service Daily Progress Note Service Page: [phone]   Subjective: Patient denies any nausea or vomiting.  She still complains of indigestion, chest discomfort, relieved by burping.  Not much better after GI cocktail and Pepcid.  No associated CP or SOB.  Otherwise, patient says she is feeling better and wants to know she can go home.  Objective: Temp:  [98.2 F (36.8 C)-99.5 F (37.5 C)] 98.7 F (37.1 C) (12/31 0740) Pulse Rate:  [90-108] 90  (12/31 0600) Resp:  [5-25] 19  (12/31 0600) BP: (136-152)/(80-97) 145/89 mmHg (12/31 0600) SpO2:  [100 %] 100 % (12/31 0600) Exam: General: awake, alert, oriented, in no acute distress Cardiovascular: RRR, no murmur  Respiratory: clear bilaterally without wheezes, rales, rhonchi  Abdomen: soft, mild diffuse tenderness on palpation,  ND, + BS  Extremities: No C/C/E  I have reviewed the patient's medications, labs, imaging, and diagnostic testing.  Notable results are summarized below.  CBC BMET   Lab 12/16/12 1217 12/15/12 1846 12/15/12 1705 12/15/12 1634  WBC 15.5* 16.3* -- 13.8*  HGB 15.1* 14.9 19.0* --  HCT 43.2 45.8 56.0* --  PLT 224 266 -- 269    Lab 12/17/12 0650 12/16/12 2352 12/16/12 1828  NA 135 135 133*  K 3.3* 3.2* 3.5  CL 101 103 102  CO2 19 21 17*  BUN 7 8 9   CREATININE 0.64 0.65 0.60  GLUCOSE 229* 289* 275*  CALCIUM 8.7 8.6 8.5     Imaging/Diagnostic Tests: Urine Pregnancy test negative.  CXR 12/15/2012 IMPRESSION:  No active cardiopulmonary disease.   Patricia Charles is a 46 y.o. year old female presenting with hyperglycemia, nausea/vomiting, and elevated anion gap concerning for DKA.   1. DKA: No clear source or insult to trigger DK, possibly recent use of cocaine either made her nauseous or was possibly tainted and patient unable to take medications  - recent gap 15 - Increase Lantus to 18 - 1 liter bolus now - Continue BMET Q8 hours, monitor acidosis and AG - Continue Lantus 15  units daily (home dose 18) and moderate SSI - HA1c pending, monitor CBG  2. Chest discomfort: likely secondary to previous episodes N/V due to DKA vs. GERD. EKG sinus tachycardia, but no ST changes.  Troponin negative x 3.  - Change to Protonix today - Start Carafate - GI cocktail PRN - Consider GI consult if chest/abdominal pain does not improve  3. Substance Abuse: has been attending rehab meetings for 2 years, this was her first relapse; Ethanol WNL  - SW consult to discuss current cocaine relapse - UDS to be collected  4. Nausea/Emesis: Improving. UA unremarkable. UPT negative. - Will continue to treat symptoms as needed  4. Hypokalemia: low 3.3 today.  - Replete K-Dur 40 today - Continue BMET q8 hours  5. HTN: BP at goal  6. HLD  - Held pravastatin as it is not formulary, will consider simvastatin or atorvastatin temporarily but will re-start pravastatin on dc   7. Neuropathy  - Hold gabapentin until nausea resolves  8. Genital herpes  - Continue home dose Acyclovir  FEN/GI: MIVF. Clear liquids Prophylaxis: Lovenox  Disposition: Home pending normalization of CBGs and further assessment  Code Status: Full  DE LA CRUZ,Shinichi Anguiano, DO 12/17/2012, 8:38 AM

## 2012-12-17 NOTE — Progress Notes (Signed)
FMTS Attending Note Patient seen and examined by me; discussed with resident team and I agree with their assessment and plan. Patient is sleeping comfortably when I enter the room.  Upon awakening, she begins to writhe in pain clutching abdomen.  This calms down after a few minutes. Complains of continued "indigestion" and nausea.  Regarding her admission for DKA, she has transisted to basal insulin, with re-widening of AG.  Plan for increase in basal insulin and administration additional NS bolus fluids to stabilize. Monitor CBG and electrolytes.  May add Carafate to PPI for abd discomfort, consider hemoccult for evaluation of possible GIB associated with retching. Repeat CBC for re-evaluation of leukocytosis, and to monitor Hgb.   Paula Compton, MD

## 2012-12-18 DIAGNOSIS — E1069 Type 1 diabetes mellitus with other specified complication: Secondary | ICD-10-CM

## 2012-12-18 DIAGNOSIS — E1010 Type 1 diabetes mellitus with ketoacidosis without coma: Secondary | ICD-10-CM

## 2012-12-18 DIAGNOSIS — E785 Hyperlipidemia, unspecified: Secondary | ICD-10-CM

## 2012-12-18 DIAGNOSIS — E1065 Type 1 diabetes mellitus with hyperglycemia: Secondary | ICD-10-CM

## 2012-12-18 DIAGNOSIS — I10 Essential (primary) hypertension: Secondary | ICD-10-CM

## 2012-12-18 LAB — BASIC METABOLIC PANEL
BUN: 5 mg/dL — ABNORMAL LOW (ref 6–23)
CO2: 25 mEq/L (ref 19–32)
CO2: 28 mEq/L (ref 19–32)
CO2: 30 mEq/L (ref 19–32)
Calcium: 8.5 mg/dL (ref 8.4–10.5)
Calcium: 9 mg/dL (ref 8.4–10.5)
Chloride: 100 mEq/L (ref 96–112)
Creatinine, Ser: 0.58 mg/dL (ref 0.50–1.10)
Creatinine, Ser: 0.58 mg/dL (ref 0.50–1.10)
Glucose, Bld: 148 mg/dL — ABNORMAL HIGH (ref 70–99)
Glucose, Bld: 180 mg/dL — ABNORMAL HIGH (ref 70–99)
Glucose, Bld: 188 mg/dL — ABNORMAL HIGH (ref 70–99)
Potassium: 3.2 mEq/L — ABNORMAL LOW (ref 3.5–5.1)
Sodium: 138 mEq/L (ref 135–145)

## 2012-12-18 LAB — GLUCOSE, CAPILLARY: Glucose-Capillary: 98 mg/dL (ref 70–99)

## 2012-12-18 MED ORDER — LIVING WELL WITH DIABETES BOOK
Freq: Once | Status: AC
  Administered 2012-12-18: 11:00:00
  Filled 2012-12-18: qty 1

## 2012-12-18 MED ORDER — POTASSIUM CHLORIDE IN NACL 40-0.9 MEQ/L-% IV SOLN
INTRAVENOUS | Status: DC
  Administered 2012-12-18 (×2): via INTRAVENOUS
  Filled 2012-12-18 (×9): qty 1000

## 2012-12-18 MED ORDER — GI COCKTAIL ~~LOC~~
30.0000 mL | Freq: Two times a day (BID) | ORAL | Status: DC | PRN
  Administered 2012-12-18 – 2012-12-19 (×2): 30 mL via ORAL
  Filled 2012-12-18 (×2): qty 30

## 2012-12-18 MED ORDER — HYDRALAZINE HCL 20 MG/ML IJ SOLN
5.0000 mg | Freq: Four times a day (QID) | INTRAMUSCULAR | Status: DC | PRN
  Administered 2012-12-19 (×2): 5 mg via INTRAVENOUS
  Filled 2012-12-18 (×2): qty 0.25

## 2012-12-18 NOTE — Progress Notes (Signed)
NURSING PROGRESS NOTE  Patricia Charles [id number] Transfer Data: 12/18/2012 5:23 PM Attending Provider: Carney Living, MD WUJ:[id number], MD Code Status: full   Patricia Charles is a 47 y.o. female patient transferred from 2900 No acute distress noted.  No c/o shortness of breath, no c/o chest pain.    Blood pressure 145/90, pulse 79, temperature 99.4 F (37.4 C), temperature source Oral, resp. rate 16, height 5\' 11"  (1.803 m), weight 73.6 kg (162 lb 4.1 oz), last menstrual period 12/13/2012, SpO2 97.00%.   IV Fluids:  IV in place, occlusive dsg intact without redness, IV cath forearm left, condition patent and no redness normal saline with 40 kcl.   Allergies:  Review of patient's allergies indicates no known allergies.  Past Medical History:   has a past medical history of Diabetes mellitus; Irregular heart beat; Depression; Herpes; Narcotic abuse; Hypertension; Anxiety; Smoker; and Polysubstance abuse.  Past Surgical History:   has past surgical history that includes Tubal ligation.  Social History:   reports that she quit smoking about 2 years ago. Her smoking use included Cigarettes. She quit after 25 years of use. She has never used smokeless tobacco. She reports that she does not drink alcohol or use illicit drugs.  Skin: intact  Orientation to room, and floor completed with information packet given to patient/family.   SR up x 2, fall assessment complete, with patient and family able to verbalize understanding of risk associated with falls, and verbalized understanding to call for assistance before getting out of bed.   Call light within reach. Patient able to voice and demonstrate understanding of unit orientation instructions.   Will cont to eval and treat per MD orders.  Patricia Charles, Elmarie Mainland, RN

## 2012-12-18 NOTE — Progress Notes (Signed)
Inpatient Diabetes Program Recommendations  AACE/ADA: New Consensus Statement on Inpatient Glycemic Control (2013)  Target Ranges:  Prepandial:   less than 140 mg/dL      Peak postprandial:   less than 180 mg/dL (1-2 hours)      Critically ill patients:  140 - 180 mg/dL   Reason for Visit: Consult re: DKA admission and A1C>15  Inpatient Diabetes Program Recommendations HgbA1C: =15.8 mean plasma glucose 407  Note: Diabetes Coordinator spoke with patient concerning diabetes management at home.  Patient states that she does not have trouble getting her insulin and usually takes is as prescribed.  States that she has not been eating a diabetes appropriate diet during the holidays. Explained that the A1C is an average for the past 2-3 months and not just last week.  Asked the patient if she takes her insulin 4 x a day and she states "yes".  Informed her that there are other insulins that can be taken 2 x day if it would be better for her schedule and she said that she has no trouble taking it 4 x day.  The patient did not have any questions/concerns at this time.   Basic inpatient education has been ordered to re-educate the patient concerning DM.  The bedside RN should initiate the videos per the Patient Education Network and document in the care plan.   Thank you  Piedad Climes [facility] Inpatient Diabetes Coordinator [phone]

## 2012-12-18 NOTE — Progress Notes (Signed)
Pt BP 146/103 at 2041 PRN hydralazine ordered from pharmacy. Medication received and BP recheck is now 155/96 P-80 med not given per PRN parameters. Med in draw if needed. Pt in bed watching TV will continue to monitor and assist as needed. Julien Nordmann [facility]

## 2012-12-18 NOTE — Progress Notes (Signed)
Family Medicine Teaching Service Daily Progress Note Service Page: [phone]   Subjective: Pt seen at bedside, states she "feels pretty poorly." Denies any nausea or vomiting but still having indigestion, chest discomfort; states "I don't know if it's gas or what," some relief with burping/flatus. Little relief with GI cocktail. Otherwise feels "okay," and glad to hear blood sugars have been some improved.  Objective: Temp:  [97.8 F (36.6 C)-99.7 F (37.6 C)] 99.1 F (37.3 C) (01/01 0725) Pulse Rate:  [85-93] 90  (01/01 0725) Resp:  [15-20] 17  (01/01 0725) BP: (122-153)/(78-99) 148/99 mmHg (01/01 0725) SpO2:  [98 %-100 %] 98 % (01/01 0725) Exam: General: adult female lying in bed, appropriate/cooperative, appears tired, in mild discomfort Cardiovascular: RRR, normal S1/S2 no murmur appreciated Respiratory: CTAB, no wheezes, good air movement Abdomen: soft, nondistended, mild diffuse tenderness slightly worse in epigastrium, BS+ Extremities: warm, well-perfused, no frank edema  Labs    12/17/2012 23:34 12/18/2012 05:35  Sodium 138 138  Potassium 3.2 (L) 3.2 (L)  Chloride 100 98  CO2 25 28  BUN 4 (L) 4 (L)  Creatinine 0.53 0.58  Calcium 8.3 (L) 8.5  GFR calc non Af Amer >90 >90  GFR calc Af Amer >90 >90  Glucose 180 (H) 188 (H)    12/16/2012 12:17 12/17/2012 09:55  WBC 15.5 (H) 11.1 (H)  RBC 5.16 (H) 4.66  Hemoglobin 15.1 (H) 13.5  HCT 43.2 39.3  Platelets 224 216   Imaging/Diagnostic Tests: Urine Pregnancy test negative.  CXR 12/15/2012 IMPRESSION:  No active cardiopulmonary disease.   Patricia Charles is a 47 y.o. year old female presenting with hyperglycemia, nausea/vomiting, and elevated anion gap concerning for DKA. Admitted 10/29.  1. DKA: No clear source or insult to trigger DK, possibly recent use of cocaine either made her nauseous or was possibly tainted and patient unable to take medications. Transitioned to subQ insulin 12/30 - last gap remains closed at  12  -continue BMP q12 - Increased Lantus to 18 on 12/31; CBG's still elevated but improved  - Continue Lantus daily (home dose 18) and moderate SSI  - Hb A1c 15.8, will order diabetes education  2. Chest discomfort: likely secondary to previous episodes N/V due to DKA vs. GERD. EKG sinus tachycardia, but no ST changes.  Troponin negative x 3. Possible gastritis given description of symptoms - Protonix and Carafate added 12/31 - GI cocktail PRN - Will consider GI consult if "indigestion" pain does not improve  3. Substance Abuse: has been attending rehab meetings for 2 years, this was her first relapse; Ethanol WNL  - SW consult pending - UDS positive for cocaine  4. Nausea/Emesis: Improving. UA unremarkable. UPT negative. - Will continue to treat symptoms as above  4. Hypokalemia: low 3.3 on 12/31, continued around 3.2-3.5 throughout day and this morning - Continue K-Dur 40 today, KCl added to IVF this morning - BMP's q8 as above  5. HTN: Intermittent elevations; BP at goal previously - Continue lisinopril - Hydralazine PRN for systolic <160, diastolic <100  6. HLD  - Holding pravastatin (not formulary) - will consider simvastatin or atorvastatin temporarily - re-start pravastatin on dc   7. Neuropathy  - Holding gabapentin  8. Genital herpes  - Continue home dose Acyclovir  FEN/GI: MIVF. Advance diet as tolerated Prophylaxis: Lovenox  Disposition: Home pending normalization of CBGs and further assessment  Code Status: Full  Patricia Charles, Cristal Deer, MD 12/18/2012, 9:10 AM

## 2012-12-18 NOTE — Progress Notes (Signed)
FMTS Attending Daily Note: Sara Neal MD [phone] pager office [phone] I  have seen and examined this patient, reviewed their chart. I have discussed this patient with the resident. I agree with the resident's findings, assessment and care plan. 

## 2012-12-19 LAB — BASIC METABOLIC PANEL
BUN: 5 mg/dL — ABNORMAL LOW (ref 6–23)
BUN: 6 mg/dL (ref 6–23)
BUN: 7 mg/dL (ref 6–23)
CO2: 24 mEq/L (ref 19–32)
Calcium: 8.7 mg/dL (ref 8.4–10.5)
Chloride: 97 mEq/L (ref 96–112)
Creatinine, Ser: 0.48 mg/dL — ABNORMAL LOW (ref 0.50–1.10)
Creatinine, Ser: 0.54 mg/dL (ref 0.50–1.10)
GFR calc Af Amer: >90 mL/min (ref >90)
GFR calc non Af Amer: >90 mL/min (ref >90)
GFR calc non Af Amer: >90 mL/min (ref >90)
Glucose, Bld: 200 mg/dL — ABNORMAL HIGH (ref 70–99)
Glucose, Bld: 169 mg/dL — ABNORMAL HIGH (ref 70–99)

## 2012-12-19 MED ORDER — LISINOPRIL 10 MG PO TABS: 20.0000 mg | ORAL_TABLET | Freq: Every day | ORAL | Status: DC

## 2012-12-19 MED ORDER — PANTOPRAZOLE SODIUM 40 MG PO TBEC: 40.0000 mg | DELAYED_RELEASE_TABLET | Freq: Every day | ORAL | Status: DC

## 2012-12-19 MED ORDER — LISINOPRIL 20 MG PO TABS
20.0000 mg | ORAL_TABLET | Freq: Every day | ORAL | Status: DC
  Administered 2012-12-19: 20 mg via ORAL
  Filled 2012-12-19: qty 1

## 2012-12-19 MED ORDER — TRAMADOL HCL 50 MG PO TABS
50.0000 mg | ORAL_TABLET | Freq: Four times a day (QID) | ORAL | Status: DC | PRN
  Administered 2012-12-19: 50 mg via ORAL
  Filled 2012-12-19: qty 1

## 2012-12-19 MED ORDER — SUCRALFATE 1 G PO TABS: 1.0000 g | ORAL_TABLET | Freq: Three times a day (TID) | ORAL | Status: DC

## 2012-12-19 MED ORDER — INSULIN GLARGINE 100 UNIT/ML ~~LOC~~ SOLN: 18.0000 [IU] | Freq: Every day | SUBCUTANEOUS | Status: DC

## 2012-12-19 NOTE — Progress Notes (Signed)
FMTS Attending Daily Note: Sara Neal MD [phone] pager office [phone] I  have seen and examined this patient, reviewed their chart. I have discussed this patient with the resident. I agree with the resident's findings, assessment and care plan. 

## 2012-12-19 NOTE — Clinical Social Work Psychosocial (Signed)
     Clinical Social Work Department BRIEF PSYCHOSOCIAL ASSESSMENT 12/19/2012  Patient:  Patricia Charles, Patricia Charles     Account Number:  [id number]     Admit date:  12/15/2012  Clinical Social Worker:  Robin Searing  Date/Time:  12/19/2012 11:44 AM  Referred by:  Physician  Date Referred:  12/19/2012 Referred for  Substance Abuse   Other Referral:   Interview type:  Patient Other interview type:    PSYCHOSOCIAL DATA Living Status:  FAMILY Admitted from facility:   Level of care:   Primary support name:  son, mother, family, sponsor Primary support relationship to patient:  FAMILY Degree of support available:   good    CURRENT CONCERNS Current Concerns  Substance Abuse   Other Concerns:    SOCIAL WORK ASSESSMENT / PLAN Met with patient who is honest about her "relapse" and use of cocaine and etoh- She states that she had been clean for 2 years 12/04/12. "I didn't even go pick up my chip".  She reports a number of stressors which caused her to make the mistake and go to [employer] to "party with some old acquaintances". She regrets this relapse- has spoken to her sponsor today and is voicing a strong commitment to get back on the right path- She shared with me her 20 year history of drug use and does not want to continue down that path- I have my own place, I am in school, I have an 8yo daughter and I don't want to feel like this any more. She acknowledges the acquaintances are not positive support for her and are only bringing her back down. She is committed to staying clean and following up with her sponsor and mental health therapist.   Assessment/plan status:  Other - See comment Other assessment/ plan:   Information/referral to community resources:   [facility]  [facility]    PATIENTS/FAMILYS RESPONSE TO PLAN OF CARE: Patient very open and appears genuinely regretful for this relapse- she is motivated to return to classes which start back on Monday, follow up with her  therapist and her sponsor.  She is eager to go home, today is her [birth year].

## 2012-12-19 NOTE — Progress Notes (Signed)
Family Medicine Teaching Service Daily Progress Note Service Page: [phone]   Subjective: Pt seen at bedside. Continues to deny nausea or vomiting but remains with "indigestion," and now with back pain that she associates with it. Otherwise feels "okay," and would like to go home today.  Objective: Temp:  [98.5 F (36.9 C)-99.4 F (37.4 C)] 99.4 F (37.4 C) (01/02 0432) Pulse Rate:  [79-100] 90  (01/02 0618) Resp:  [14-20] 20  (01/02 0432) BP: (141-155)/(90-114) 141/97 mmHg (01/02 0618) SpO2:  [97 %-100 %] 100 % (01/02 0432) Exam: General: adult female lying in bed, appropriate/cooperative Cardiovascular: RRR, normal S1/S2 no murmur appreciated Respiratory: CTAB, no wheezes, good air movement Chest/back: no reproducible tenderness other than in epigastrium; back between shoulder blades not tender Abdomen: soft, nondistended, mild diffuse tenderness slightly worse in epigastrium, BS+ Extremities: warm, well-perfused, no frank edema  Labs  12/18/2012 23:30 12/19/2012 06:08  Sodium 134 (L) 137  Potassium 3.6 4.1  Chloride 96 97  CO2 27 24  BUN 5 (L) 6  Creatinine 0.53 0.48 (L)  Calcium 8.7 9.1  GFR calc non Af Amer >90 >90  GFR calc Af Amer >90 >90  Glucose 200 (H) 178 (H)   Imaging/Diagnostic Tests: Urine Pregnancy test negative.  CXR 12/15/2012 IMPRESSION:  No active cardiopulmonary disease.   Patricia Charles is a 47 y.o. year old female presenting with hyperglycemia, nausea/vomiting, and elevated anion gap concerning for DKA. Admitted 10/29.  1. DKA: No clear source or insult to trigger DK, possibly recent use of cocaine either made her nauseous or was possibly tainted and patient unable to take medications. Transitioned to subQ insulin 12/30 - gap this morning 16 - will recheck BMP this afternoon - Increased Lantus to 18 on 12/31; CBG's still elevated but improved  - Continue Lantus daily (home dose 18) and moderate SSI  - holding metformin, to restart at discharge  -  Hb A1c 15.8, diabetes education completed  2. Chest discomfort: likely secondary to previous episodes N/V due to DKA vs. GERD. EKG sinus tachycardia, but no ST changes.  Troponin negative x 3. Possible gastritis given description of symptoms; pt still with complaints, 1/2 - Protonix and Carafate added 12/31 - GI cocktail PRN - Will consider GI consult; per pt preference, will give PPI and carafate more time to work, and pt will likely follow up as an outpt with any further complaints  3. Substance Abuse: has been attending rehab meetings for 2 years, this was her first relapse; Ethanol WNL  - SW consult pending, appreciated - UDS positive for cocaine  4. Nausea/Emesis: Improving. UA unremarkable. UPT negative. - Will continue to treat symptoms as above - No symptoms, currently; Zofran PRN  4. Hypokalemia: low 3.3 on 12/31, continued around 3.2-3.5 throughout day and this morning - Resolved 1/2 (K 4.1) - KCl in IVF, no oral replacement today  5. HTN: Intermittent elevations; BP at goal previously - Hydralazine PRN for systolic <160, diastolic <100; prn dose required this morning early AM - will increase Lisinopril to 20 mg daily  6. HLD  - Holding pravastatin (not formulary) - will consider simvastatin or atorvastatin temporarily - re-start pravastatin on dc   7. Neuropathy  - Holding gabapentin  8. Genital herpes  - Continue home dose Acyclovir  FEN/GI: MIVF. Advance diet as tolerated Prophylaxis: Lovenox  Disposition: Home pending normalization of CBGs and further assessment; possible discharge home this afternoon Code Status: Full  Patricia Charles, Cristal Deer, MD 12/19/2012, 10:11 AM

## 2012-12-19 NOTE — Progress Notes (Signed)
NURSING PROGRESS NOTE  Patricia Charles [id number] Discharge Data: 12/19/2012 4:47 PM Attending Provider: No att. providers found WGN:[id number], MD     Gerald Leitz to be D/C'd Home per MD order.  Discussed with the patient the After Visit Summary and all questions fully answered. All IV's discontinued with no bleeding noted. All belongings returned to patient for patient to take home.   Last Vital Signs:  Blood pressure 158/102, pulse 96, temperature 98.9 F (37.2 C), temperature source Oral, resp. rate 20, height 5\' 11"  (1.803 m), weight 73.6 kg (162 lb 4.1 oz), last menstrual period 12/13/2012, SpO2 100.00%.  Discharge Medication List   Medication List     As of 12/19/2012  4:47 PM    TAKE these medications         acyclovir 400 MG tablet   Commonly known as: ZOVIRAX   Take 1 tablet (400 mg total) by mouth 3 (three) times daily.      acyclovir 400 MG tablet   Commonly known as: ZOVIRAX   Take 1 tablet (400 mg total) by mouth 2 (two) times daily.      gabapentin 300 MG capsule   Commonly known as: NEURONTIN   Take 1 capsule (300 mg total) by mouth 3 (three) times daily.      insulin aspart 100 UNIT/ML injection   Commonly known as: novoLOG   Inject 4 Units into the skin 3 (three) times daily before meals.      insulin glargine 100 UNIT/ML injection   Commonly known as: LANTUS   Inject 18 Units into the skin at bedtime.      lisinopril 10 MG tablet   Commonly known as: PRINIVIL,ZESTRIL   Take 2 tablets (20 mg total) by mouth daily.      metFORMIN 750 MG 24 hr tablet   Commonly known as: GLUCOPHAGE-XR   Take 2 tablets (1,500 mg total) by mouth daily with supper.      pantoprazole 40 MG tablet   Commonly known as: PROTONIX   Take 1 tablet (40 mg total) by mouth daily.      pravastatin 10 MG tablet   Commonly known as: PRAVACHOL   Take 1 tablet (10 mg total) by mouth daily.      sucralfate 1 G tablet   Commonly known as: CARAFATE   Take 1 tablet (1 g total)  by mouth 4 (four) times daily -  with meals and at bedtime.        Charma Mocarski, Elmarie Mainland, RN

## 2012-12-20 NOTE — Discharge Summary (Signed)
Family Medicine Teaching [facility] Discharge Summary  Patient name: Patricia Charles Medical record number: [id number] Date of birth: 1966-05-15 Age: 47 y.o. Gender: female Date of Admission: 12/15/2012  Date of Discharge: 1/2 Admitting Physician: Carney Living, MD  Primary Care Provider: Demetria Pore, MD  Indication for Hospitalization: nausea, vomiting, hyperglycemia Discharge Diagnoses:  DKA DM type 1, uncontrolled Abdominal pain HTN HLD Hx of HSV Substance abuse (cocaine)  Consultations: none  Significant Labs and Imaging:  Admit labs:  Na 134  K 5.2  Cl 81  CO2 8  BUN 38  Cr 1.56  Glu 844  Anion gap 45  Arterial pH 7.17 / pCO2 16.7 / pO2 104 / bicarb 6.2 Discharge labs:  Na 136  K 4.3  Cl 97  CO2 28  BUN 7  Cr 0.54  Glu 169  Anion gap 11  H.pylori   IgG 0.57   IgA PENDING Urine Pregnancy test negative.   CXR 12/15/2012  IMPRESSION:  No active cardiopulmonary disease.   Procedures: none  Brief Hospital Course: Patricia Charles is a 47 y.o. year old female presenting with hyperglycemia, nausea/vomiting, and elevated anion gap concerning for DKA. Admitted 10/29. Pt was found to be dehydrated in the ED with elevated potassium; she was bolused fluids, given calcium gluconate, and started on insulin drip. She was transitioned to subQ insulin and improved subjectively with IVF. Of note, pt complained of "indigestion" throughout the hospital stay minimally improved with PPI and Carafate; final H.pylori labs pending. At time of discharge, pt is hemodynamically stable and otherwise improved. See below by problem list.  1. DKA: No clear source or insult to trigger DKA; of note, pt admitted to recent alcohol and cocaine use just prior to becoming ill (possibly recent use of cocaine plus/minus possibility of tainted drug and/or subsequent inablity to take medications). Transitioned to subQ insulin 12/30  - gap morning of discharge was 16, rechecked at 11  prior to discharge  - CBG's still elevated but improved  - Continued Lantus daily (home dose 18) as well as Novolog 4 units at mealtimes - Metformin  - Hb A1c 15.8, diabetes education completed prior to discharge   2. Chest discomfort, "indigestion": likely secondary to previous episodes N/V due to DKA vs. GERD. EKG sinus tachycardia, but no ST changes. Troponin negative x 3. Possible gastritis/PUD given description of symptoms of burning epigastric pain with some radiation to back; pt still with complaints, 1/2  - Protonix and Carafate added 12/31; GI cocktail PRN had little effect on symptoms - considered GI consult; per pt preference, will give PPI and carafate more time to work, and pt will likely follow up as an outpt with any further complaints - of note, H.pylori considered and IgG positive at 0.57; IgA pending at time of discharge (see follow-up, below)  3. Substance Abuse: has been attending rehab meetings for 2 years, this was her first relapse; Ethanol WNL  - SW consult completed, pt counseled on substance use and encouraged to continue utilizing community resources; assistance appreciated  - UDS positive for cocaine this admission; pt admitted use, openly   4. Nausea/Emesis: Improving/improved at time of discharge, likely related to #2 above. UA unremarkable. UPT negative.   - No symptoms, at time of discharge; treated Zofran PRN  4. Hypokalemia: low 3.3 on 12/31, continued around 3.2-3.5 throughout day/night 1/1-1/2 - replaced orally and in IVF, resolved 1/2 (K 4.1)  5. HTN: Intermittent elevations; BP at goal previously  - Hydralazine PRN for systolic <  160, diastolic <100; prn dose required early AM prior to discharge - increased Lisinopril to 20 mg daily  6. HLD - Held pravastatin (not formulary) during admission; considered simvastatin or atorvastatin temporarily but simply re-started pravastatin at discharge.   7. Neuropathy - Held gabapentin during admission, restarted at  discharge.  8. Genital herpes - Per pt history. Continued home dose Acyclovir  Discharge Medications:    Medication List     As of 12/20/2012  3:40 PM    TAKE these medications         acyclovir 400 MG tablet   Commonly known as: ZOVIRAX   Take 1 tablet (400 mg total) by mouth 3 (three) times daily.      acyclovir 400 MG tablet   Commonly known as: ZOVIRAX   Take 1 tablet (400 mg total) by mouth 2 (two) times daily.      gabapentin 300 MG capsule   Commonly known as: NEURONTIN   Take 1 capsule (300 mg total) by mouth 3 (three) times daily.      insulin aspart 100 UNIT/ML injection   Commonly known as: novoLOG   Inject 4 Units into the skin 3 (three) times daily before meals.      insulin glargine 100 UNIT/ML injection   Commonly known as: LANTUS   Inject 18 Units into the skin at bedtime.      lisinopril 10 MG tablet   Commonly known as: PRINIVIL,ZESTRIL   Take 2 tablets (20 mg total) by mouth daily.      metFORMIN 750 MG 24 hr tablet   Commonly known as: GLUCOPHAGE-XR   Take 2 tablets (1,500 mg total) by mouth daily with supper.      pantoprazole 40 MG tablet   Commonly known as: PROTONIX   Take 1 tablet (40 mg total) by mouth daily.      pravastatin 10 MG tablet   Commonly known as: PRAVACHOL   Take 1 tablet (10 mg total) by mouth daily.      sucralfate 1 G tablet   Commonly known as: CARAFATE   Take 1 tablet (1 g total) by mouth 4 (four) times daily -  with meals and at bedtime.       Disposition: discharge home  Issues for Follow Up:  1. DM - CGB's greatly improved s/p insulin drip and restarting home Lantus; pt covered with Novolog during admission and restarted on metformin XR 1500 mg daily. Of note, A1c this admission was 15.8; "good control" will likely be a long-term goal. Follow up on med compliance and general diabetes counseling. 2. "Indigestion" - Pt started on PPI (Protonix 40 daily) and Carafate (1g 4x/day) and had little subjective improvement  of symptoms. Follow up H.pylori labs (IgG positive, IgA pending) and treat as appropriate. 3. HTN - Pt has generally good BP control, but had intermittent elevations during the hospital stay (140's-160's over 80's-90's). Lisinopril was increased from 10 mg daily to 20 mg daily. Follow up monitoring pressures and checking any labs, as appropriate. Recommend checking BMP at follow-up. 4. Substance use - UDS positive for cocaine, pt with admitted use and is already involved with local support groups/systems. Pt completed substance abuse counseling and is to continue utilizing community resources, mentor/sponsor, etc. Follow up as appropriate.  Outstanding Results: final H.pylori labs (IgA)  Discharge Instructions: Please refer to Patient Instructions section of EMR for full details.  Patient was counseled important signs and symptoms that should prompt return to medical care, changes in  medications, dietary instructions, activity restrictions, and follow up appointments.       Follow-up Information    Follow up with DE LA CRUZ,IVY, DO. On 12/25/2012. (Appt time is 2:30 PM)    Contact information:   [address] [place] [address] [phone]         Discharge Condition: stable  [address], [place], MD 12/20/2012, 3:40 PM

## 2012-12-21 DIAGNOSIS — E1110 Type 2 diabetes mellitus with ketoacidosis without coma: Secondary | ICD-10-CM | POA: Diagnosis present

## 2012-12-21 DIAGNOSIS — F1910 Other psychoactive substance abuse, uncomplicated: Secondary | ICD-10-CM | POA: Insufficient documentation

## 2012-12-23 NOTE — Discharge Summary (Signed)
Family Medicine Teaching Service  Discharge Note : Attending Riddhi Grether MD Pager [phone] Office [phone] I have seen and examined this patient, reviewed their chart and discussed discharge planning wit the resident at the time of discharge. I agree with the discharge plan as above.  

## 2012-12-25 ENCOUNTER — Inpatient Hospital Stay: Payer: Medicaid Other | Admitting: Family Medicine

## 2013-02-05 ENCOUNTER — Telehealth: Payer: Self-pay | Admitting: *Deleted

## 2013-02-05 NOTE — Telephone Encounter (Signed)
Left message for pt to schedule A1C check.

## 2013-03-05 ENCOUNTER — Encounter: Payer: Self-pay | Admitting: *Deleted

## 2013-03-24 ENCOUNTER — Telehealth: Payer: Self-pay | Admitting: Family Medicine

## 2013-03-24 NOTE — Telephone Encounter (Signed)
Please call pt and tell her that I need her to have her records faxed from [facility] (at least the discharge summary) since she was admitted there- I need to be able to see what was done during that hospitalization BEFORE her follow up with me on Wednesday. Thanks!

## 2013-03-24 NOTE — Telephone Encounter (Signed)
Called pt and left message to please call us. Waiting for call back. Please see message. Lorenda Hatchet, Renato Battles

## 2013-03-25 NOTE — Telephone Encounter (Signed)
Pt never called back. Sorry. Lorenda Hatchet, Renato Battles

## 2013-03-25 NOTE — Telephone Encounter (Signed)
Called again and left message to call us back. See previous message. Dr.McGill needs D/C summary from [facility] before appt on Wed. Lorenda Hatchet, Renato Battles

## 2013-03-26 ENCOUNTER — Encounter: Payer: Self-pay | Admitting: Family Medicine

## 2013-03-26 ENCOUNTER — Ambulatory Visit (INDEPENDENT_AMBULATORY_CARE_PROVIDER_SITE_OTHER): Payer: Medicaid Other | Admitting: Family Medicine

## 2013-03-26 VITALS — BP 122/82 | HR 103 | Ht 71.0 in | Wt 164.0 lb

## 2013-03-26 DIAGNOSIS — E1110 Type 2 diabetes mellitus with ketoacidosis without coma: Secondary | ICD-10-CM

## 2013-03-26 DIAGNOSIS — E1165 Type 2 diabetes mellitus with hyperglycemia: Secondary | ICD-10-CM

## 2013-03-26 DIAGNOSIS — IMO0002 Reserved for concepts with insufficient information to code with codable children: Secondary | ICD-10-CM

## 2013-03-26 DIAGNOSIS — I10 Essential (primary) hypertension: Secondary | ICD-10-CM

## 2013-03-26 LAB — BASIC METABOLIC PANEL
BUN: 14 mg/dL (ref 6–23)
Chloride: 83 mEq/L — ABNORMAL LOW (ref 96–112)
Potassium: 5.5 mEq/L — ABNORMAL HIGH (ref 3.5–5.3)
Sodium: 121 mEq/L — ABNORMAL LOW (ref 135–145)

## 2013-03-26 LAB — POCT UA - MICROSCOPIC ONLY

## 2013-03-26 LAB — POCT URINALYSIS DIPSTICK
Bilirubin, UA: NEGATIVE
Blood, UA: NEGATIVE
Glucose, UA: 500
Nitrite, UA: POSITIVE
Spec Grav, UA: <=1.005

## 2013-03-26 LAB — GLUCOSE, CAPILLARY: Glucose-Capillary: >600 mg/dL (ref 70–99)

## 2013-03-26 LAB — POCT GLYCOSYLATED HEMOGLOBIN (HGB A1C): Hemoglobin A1C: >14

## 2013-03-26 NOTE — Addendum Note (Signed)
Addended by: [place], Kaidin Boehle on: 03/26/2013 05:01 PM   Modules accepted: Orders

## 2013-03-26 NOTE — Assessment & Plan Note (Addendum)
A1c still >14.  Recent admit at [facility] for DKA 3/28-3/30 which she left AMA.  CBG too high to read today but pt reports eating just before coming and negative ketones in urine, so no need to hospitalize at this time.  Pt reports not taking novolog in >1 year and cannot currently afford metformin.  Using lantus 18U with suboptimal control, will increase to 25U with increase to 27U tomorrow night is still >250.  Check stat BMET to ensure no gap and that ARF resolved-- if large gap, will call pt to go to ER for admission at [phone].  Otherwise, f/u in 2-4 days to ensure she is still tolerating po and check on CBGs.

## 2013-03-26 NOTE — Patient Instructions (Addendum)
It was good to see you today.  We are checking some blood work.  Drink a LOT of WATER at home tonight.  Avoid carbohydrates and sweets.  Take 25 units of lantus tonight.  If you sugar is more than 200 tomorrow morning, take 27 units tomorrow night.  Go to the ER if you start having dizziness, chest pain, nausea, vomiting.  We will wait to restart your lisinopril since your blood pressure is ok today and you have difficulties paying for it.

## 2013-03-26 NOTE — Progress Notes (Signed)
S: Pt comes in today for hospital follow up.  Was admitted at [facility] from 3/28-3/30 for DKA (N/V, CBG >500, gap of 36, + urine ketones) but left AMA on 3/30.  Pt's gap started to close 3/28 but re-opened 3/29; it is unclear if her gap was closed when she left.  She also had ARF with Cr of 1.83 on admission, improved to 0.79 on day of leaving AMA.  Since d/c home, she has been feeling fine.  No dizziness, blurry vision, SOB, CP, or N/V.  She has been eating and drinking fine.  She thinks that the reason her CBG is too high to read today is because she just went out to lunch and had a lot of sodas as well as sweet tea and pink lemonade.  Her CBGs at home are typically 200-300; yesterday it was in the low 200's.  She is taking her lantus 18 qhs; she is not taking her novolog (for at least a year) or metformin due to losing her job and not being able to afford it, although her Medicaid is still active.  She is also not taking her lisinopril for the same reason (it was help in house due to her ARF).    ROS: Per HPI  History  Smoking status  . Former Smoker -- 25 years  . Types: Cigarettes  . Quit date: 12/04/2010  Smokeless tobacco  . Never Used    O:  Filed Vitals:   03/26/13 1400  BP: 122/82  Pulse: 103    Gen: NAD HEENT: MMM, appears well hydrated CV: RRR, no murmur Pulm: CTA bilat, no wheezes or crackles Abd: soft, NT Ext: Warm, no edema   A/P: 47 y.o. female p/w HFU for DKA -See problem list -f/u in 2 days

## 2013-03-26 NOTE — Assessment & Plan Note (Signed)
Off lisinopril, but BP well controlled today.  Will add back when pt's finances are better.  Was on 10mg .

## 2013-03-31 ENCOUNTER — Telehealth: Payer: Self-pay | Admitting: *Deleted

## 2013-03-31 NOTE — Telephone Encounter (Signed)
F/U health coach call to check med adherence and CBG's. Family reports pt admitted to [facility].

## 2013-05-01 ENCOUNTER — Encounter: Payer: Self-pay | Admitting: Family Medicine

## 2013-05-01 NOTE — Telephone Encounter (Signed)
error 

## 2013-05-26 ENCOUNTER — Ambulatory Visit (INDEPENDENT_AMBULATORY_CARE_PROVIDER_SITE_OTHER): Payer: Medicaid Other | Admitting: *Deleted

## 2013-05-26 DIAGNOSIS — Z111 Encounter for screening for respiratory tuberculosis: Secondary | ICD-10-CM

## 2013-05-26 NOTE — Progress Notes (Signed)
Tuberculin skin test applied to right ventral forearm. Pt to return wed to have test read. Wyatt Haste, RN-BSN

## 2013-05-28 ENCOUNTER — Ambulatory Visit (INDEPENDENT_AMBULATORY_CARE_PROVIDER_SITE_OTHER): Payer: Medicaid Other | Admitting: *Deleted

## 2013-05-28 ENCOUNTER — Encounter: Payer: Self-pay | Admitting: *Deleted

## 2013-05-28 DIAGNOSIS — Z111 Encounter for screening for respiratory tuberculosis: Secondary | ICD-10-CM

## 2013-06-26 ENCOUNTER — Other Ambulatory Visit: Payer: Self-pay

## 2013-07-01 ENCOUNTER — Telehealth: Payer: Self-pay | Admitting: Emergency Medicine

## 2013-07-01 NOTE — Telephone Encounter (Signed)
Received d/c summary from hospitalization at [facility]. Admitted 7/5 - 06/23/13 for hyperosmotic hyperglycemia syndrome. A1c was 11.6. D/c's on Lantus 35 units BID and lispo 8 units with meals.

## 2013-07-02 ENCOUNTER — Inpatient Hospital Stay: Payer: Medicaid Other | Admitting: Emergency Medicine

## 2013-10-01 ENCOUNTER — Other Ambulatory Visit: Payer: Self-pay | Admitting: Emergency Medicine

## 2013-10-01 DIAGNOSIS — E1140 Type 2 diabetes mellitus with diabetic neuropathy, unspecified: Secondary | ICD-10-CM

## 2013-10-01 DIAGNOSIS — E119 Type 2 diabetes mellitus without complications: Secondary | ICD-10-CM

## 2013-10-01 MED ORDER — INSULIN GLARGINE 100 UNIT/ML ~~LOC~~ SOLN: 35.0000 [IU] | Freq: Every day | SUBCUTANEOUS | Status: DC

## 2013-10-01 MED ORDER — GABAPENTIN 300 MG PO CAPS: 300.0000 mg | ORAL_CAPSULE | Freq: Three times a day (TID) | ORAL | Status: DC

## 2013-10-16 ENCOUNTER — Emergency Department (HOSPITAL_COMMUNITY)
Admission: EM | Admit: 2013-10-16 | Discharge: 2013-10-16 | Disposition: A | Discharge status: Home / Self Care | Payer: Medicaid Other | Attending: Emergency Medicine | Admitting: Emergency Medicine

## 2013-10-16 ENCOUNTER — Encounter (HOSPITAL_COMMUNITY): Payer: Self-pay | Admitting: Emergency Medicine

## 2013-10-16 DIAGNOSIS — Z87891 Personal history of nicotine dependence: Secondary | ICD-10-CM | POA: Insufficient documentation

## 2013-10-16 DIAGNOSIS — E119 Type 2 diabetes mellitus without complications: Secondary | ICD-10-CM | POA: Insufficient documentation

## 2013-10-16 DIAGNOSIS — F3289 Other specified depressive episodes: Secondary | ICD-10-CM | POA: Insufficient documentation

## 2013-10-16 DIAGNOSIS — T3995XA Adverse effect of unspecified nonopioid analgesic, antipyretic and antirheumatic, initial encounter: Secondary | ICD-10-CM | POA: Insufficient documentation

## 2013-10-16 DIAGNOSIS — I10 Essential (primary) hypertension: Secondary | ICD-10-CM | POA: Insufficient documentation

## 2013-10-16 DIAGNOSIS — F329 Major depressive disorder, single episode, unspecified: Secondary | ICD-10-CM | POA: Insufficient documentation

## 2013-10-16 DIAGNOSIS — Z8619 Personal history of other infectious and parasitic diseases: Secondary | ICD-10-CM | POA: Insufficient documentation

## 2013-10-16 DIAGNOSIS — F411 Generalized anxiety disorder: Secondary | ICD-10-CM | POA: Insufficient documentation

## 2013-10-16 DIAGNOSIS — Z794 Long term (current) use of insulin: Secondary | ICD-10-CM | POA: Insufficient documentation

## 2013-10-16 DIAGNOSIS — R51 Headache: Secondary | ICD-10-CM | POA: Insufficient documentation

## 2013-10-16 DIAGNOSIS — Z79899 Other long term (current) drug therapy: Secondary | ICD-10-CM | POA: Insufficient documentation

## 2013-10-16 DIAGNOSIS — R42 Dizziness and giddiness: Secondary | ICD-10-CM | POA: Insufficient documentation

## 2013-10-16 LAB — CBC WITH DIFFERENTIAL/PLATELET
Basophils Absolute: 0 10*3/uL (ref 0.0–0.1)
Basophils Relative: 0 % (ref 0–1)
Eosinophils Absolute: 0.2 10*3/uL (ref 0.0–0.7)
Eosinophils Relative: 2 % (ref 0–5)
HCT: 43.5 % (ref 36.0–46.0)
MCH: 30.4 pg (ref 26.0–34.0)
MCHC: 35.4 g/dL (ref 30.0–36.0)
MCV: 85.8 fL (ref 78.0–100.0)
Monocytes Absolute: 0.4 10*3/uL (ref 0.1–1.0)
Platelets: 314 10*3/uL (ref 150–400)
RDW: 13.9 % (ref 11.5–15.5)
WBC: 7.6 10*3/uL (ref 4.0–10.5)

## 2013-10-16 LAB — BASIC METABOLIC PANEL
CO2: 27 mEq/L (ref 19–32)
Calcium: 9.6 mg/dL (ref 8.4–10.5)
Creatinine, Ser: 0.79 mg/dL (ref 0.50–1.10)
GFR calc non Af Amer: >90 mL/min (ref >90)
Sodium: 130 mEq/L — ABNORMAL LOW (ref 135–145)

## 2013-10-16 LAB — RAPID URINE DRUG SCREEN, HOSP PERFORMED
Amphetamines: NOT DETECTED
Opiates: NOT DETECTED

## 2013-10-16 LAB — GLUCOSE, CAPILLARY: Glucose-Capillary: 263 mg/dL — ABNORMAL HIGH (ref 70–99)

## 2013-10-16 NOTE — ED Notes (Signed)
Pt presents with 2 day h/o dizziness, reports her CBG is over 200.  Pt reports she was staggering at work, reports her job insists on pt being drug tested.

## 2013-10-16 NOTE — ED Notes (Signed)
Pt reports had h/a last night & this morning. States took OTC "pain relief" from son's bottle. States has had 2 brief episodes of dizziness & lightheadedness lasting <5 min.  Pt worried that pills in son's bottle may not have been what bottle stated. "I wouldn't put anything past him". Pt requesting to be checked to find out what she took.  Presently with no symptoms, denies any pain or dizziness. NAD. Pt ambulatory from triage, gait steady

## 2013-10-16 NOTE — ED Provider Notes (Signed)
CSN: [id number]     Arrival date & time 10/16/13  1257 History   First MD Initiated Contact with Patient 10/16/13 1554     No chief complaint on file.  (Consider location/radiation/quality/duration/timing/severity/associated sxs/prior Treatment) The history is provided by the patient.   patient states that she had a little but the headache last night so she took one of her sons pain pills. She states she does not know what it was but it was in an over-the-counter bottle. She states she began to feel more dizzy. She states she had felt dizzy before or after that began to feel dizzy dizzy. No chest pain. No confusion. She states her headache is resolved. She states her sugars been running a little high. She states this is usual for her. No chest pain. No trouble breathing. No numbness or weakness. No difficulty walking. She states she would like a drug screen to find out what it was.  Past Medical History  Diagnosis Date  . Diabetes mellitus     A1C 15.8   12/17/12  . Irregular heart beat   . Depression     Was taking abilify, celexa  . Herpes     Takes acyclovir BID  . Narcotic abuse     Attends NA mtgs  . Hypertension   . Anxiety   . Smoker   . Polysubstance abuse     (+) cocaine 12/15/12   Past Surgical History  Procedure Laterality Date  . Tubal ligation     Family History  Problem Relation Age of Onset  . Heart disease Mother   . Hypertension Mother   . Coronary artery disease Mother    History  Substance Use Topics  . Smoking status: Former Smoker -- 25 years    Types: Cigarettes    Quit date: 12/04/2010  . Smokeless tobacco: Never Used  . Alcohol Use: No     Comment: drank when she used   OB History   Grav Para Term Preterm Abortions TAB SAB Ect Mult Living   4 2 0  2 1 1   2      Review of Systems  Constitutional: Negative for activity change and appetite change.  Eyes: Negative for pain.  Respiratory: Negative for chest tightness and shortness of breath.    Cardiovascular: Negative for chest pain and leg swelling.  Gastrointestinal: Negative for nausea, vomiting, abdominal pain and diarrhea.  Genitourinary: Negative for flank pain.  Musculoskeletal: Negative for back pain and neck stiffness.  Skin: Negative for rash.  Neurological: Positive for dizziness and headaches. Negative for weakness and numbness.  Psychiatric/Behavioral: Negative for behavioral problems.    Allergies  Review of patient's allergies indicates no known allergies.  Home Medications   Current Outpatient Rx  Name  Route  Sig  Dispense  Refill  . acyclovir (ZOVIRAX) 400 MG tablet   Oral   Take 1 tablet (400 mg total) by mouth 2 (two) times daily.   60 tablet   11   . gabapentin (NEURONTIN) 300 MG capsule   Oral   Take 1 capsule (300 mg total) by mouth 3 (three) times daily.   90 capsule   0     Patient needs appt with PCP.   Marland Kitchen insulin aspart (NOVOLOG) 100 UNIT/ML injection   Subcutaneous   Inject 4 Units into the skin 3 (three) times daily before meals.   1 vial   1   . insulin glargine (LANTUS) 100 UNIT/ML injection   Subcutaneous  Inject 0.35 mLs (35 Units total) into the skin at bedtime.   10 mL   1     Patient needs appt with PCP.   Marland Kitchen lisinopril (PRINIVIL,ZESTRIL) 10 MG tablet   Oral   Take 2 tablets (20 mg total) by mouth daily.   60 tablet   5   . metFORMIN (GLUCOPHAGE-XR) 750 MG 24 hr tablet   Oral   Take 2 tablets (1,500 mg total) by mouth daily with supper.   60 tablet   5   . pravastatin (PRAVACHOL) 10 MG tablet   Oral   Take 1 tablet (10 mg total) by mouth daily.          BP 158/93  Pulse 77  Temp(Src) 98.2 F (36.8 C) (Oral)  Resp 18  SpO2 97%  LMP 08/24/2013 Physical Exam  Nursing note and vitals reviewed. Constitutional: She is oriented to person, place, and time. She appears well-developed and well-nourished.  HENT:  Head: Normocephalic and atraumatic.  Eyes: EOM are normal. Pupils are equal, round, and  reactive to light.  Neck: Normal range of motion. Neck supple.  Cardiovascular: Normal rate, regular rhythm and normal heart sounds.   No murmur heard. Pulmonary/Chest: Effort normal and breath sounds normal. No respiratory distress. She has no wheezes. She has no rales.  Abdominal: Soft. Bowel sounds are normal. She exhibits no distension. There is no tenderness. There is no rebound and no guarding.  Musculoskeletal: Normal range of motion.  Neurological: She is alert and oriented to person, place, and time. No cranial nerve deficit.  Skin: Skin is warm and dry.  Psychiatric: She has a normal mood and affect. Her speech is normal.    ED Course  Procedures (including critical care time) Labs Review Labs Reviewed  CBC WITH DIFFERENTIAL - Abnormal; Notable for the following:    Hemoglobin 15.4 (*)    All other components within normal limits  BASIC METABOLIC PANEL - Abnormal; Notable for the following:    Sodium 130 (*)    Chloride 95 (*)    Glucose, Bld 263 (*)    All other components within normal limits  GLUCOSE, CAPILLARY - Abnormal; Notable for the following:    Glucose-Capillary 263 (*)    All other components within normal limits  URINE RAPID DRUG SCREEN (HOSP PERFORMED)   Imaging Review No results found.  EKG Interpretation   None       MDM   1. Dizziness    Patient with mild headache and felt dizzy. Mild hyperglycemia. She also took a unknown pill and felt worse with it. Laboratory reassuring.    Juliet Rude. Rubin Payor, MD 10/16/13 [id number]

## 2013-10-23 ENCOUNTER — Other Ambulatory Visit: Payer: Self-pay

## 2013-10-29 ENCOUNTER — Ambulatory Visit (INDEPENDENT_AMBULATORY_CARE_PROVIDER_SITE_OTHER): Payer: Medicaid Other | Admitting: Emergency Medicine

## 2013-10-29 ENCOUNTER — Encounter: Payer: Self-pay | Admitting: Emergency Medicine

## 2013-10-29 VITALS — BP 130/90 | HR 81 | Temp 98.2°F | Ht 71.0 in | Wt 185.0 lb

## 2013-10-29 DIAGNOSIS — E1165 Type 2 diabetes mellitus with hyperglycemia: Secondary | ICD-10-CM

## 2013-10-29 DIAGNOSIS — E785 Hyperlipidemia, unspecified: Secondary | ICD-10-CM

## 2013-10-29 DIAGNOSIS — E1149 Type 2 diabetes mellitus with other diabetic neurological complication: Secondary | ICD-10-CM

## 2013-10-29 DIAGNOSIS — I10 Essential (primary) hypertension: Secondary | ICD-10-CM

## 2013-10-29 DIAGNOSIS — F17200 Nicotine dependence, unspecified, uncomplicated: Secondary | ICD-10-CM

## 2013-10-29 DIAGNOSIS — B009 Herpesviral infection, unspecified: Secondary | ICD-10-CM

## 2013-10-29 DIAGNOSIS — E119 Type 2 diabetes mellitus without complications: Secondary | ICD-10-CM

## 2013-10-29 DIAGNOSIS — Z720 Tobacco use: Secondary | ICD-10-CM

## 2013-10-29 DIAGNOSIS — E1142 Type 2 diabetes mellitus with diabetic polyneuropathy: Secondary | ICD-10-CM

## 2013-10-29 DIAGNOSIS — E1140 Type 2 diabetes mellitus with diabetic neuropathy, unspecified: Secondary | ICD-10-CM

## 2013-10-29 LAB — POCT GLYCOSYLATED HEMOGLOBIN (HGB A1C): Hemoglobin A1C: 9.7

## 2013-10-29 MED ORDER — GABAPENTIN 300 MG PO CAPS: 300.0000 mg | ORAL_CAPSULE | Freq: Three times a day (TID) | ORAL | Status: DC

## 2013-10-29 MED ORDER — INSULIN GLARGINE 100 UNIT/ML ~~LOC~~ SOLN: 26.0000 [IU] | SUBCUTANEOUS | Status: DC

## 2013-10-29 MED ORDER — ACCU-CHEK NANO SMARTVIEW W/DEVICE KIT: 1.0000 | PACK | Freq: Once | Status: DC

## 2013-10-29 MED ORDER — METFORMIN HCL ER 750 MG PO TB24: 1500.0000 mg | ORAL_TABLET | Freq: Every day | ORAL | Status: AC

## 2013-10-29 MED ORDER — ACCU-CHEK FASTCLIX LANCETS MISC: 1.0000 | Freq: Three times a day (TID) | Status: DC

## 2013-10-29 MED ORDER — GLUCOSE BLOOD VI STRP: ORAL_STRIP | Status: DC

## 2013-10-29 MED ORDER — LISINOPRIL 10 MG PO TABS: 10.0000 mg | ORAL_TABLET | Freq: Every day | ORAL | Status: DC

## 2013-10-29 MED ORDER — INSULIN ASPART 100 UNIT/ML ~~LOC~~ SOLN: 6.0000 [IU] | Freq: Three times a day (TID) | SUBCUTANEOUS | Status: DC

## 2013-10-29 MED ORDER — ACYCLOVIR 400 MG PO TABS: 400.0000 mg | ORAL_TABLET | Freq: Two times a day (BID) | ORAL | Status: DC

## 2013-10-29 MED ORDER — PRAVASTATIN SODIUM 10 MG PO TABS: 10.0000 mg | ORAL_TABLET | Freq: Every day | ORAL | Status: DC

## 2013-10-29 NOTE — Progress Notes (Signed)
  Subjective:    Patient ID: Gerald Leitz, female    DOB: 1966-04-07, 47 y.o.   MRN: [id number]  HPI Consandra Laske is here for f/u dm and htn.  Hypertension Compliant with medication: yes Side effects from medication: no Check BP at home: no  Chest pain: no Palpitations: no Vision changes: no Leg edema: no Dizziness: no  Diabetes Compliant with medications: yes  - metformin; lantus 26 units; novolog 6 units with meals Side effects from medications: no Check sugars at home: no  Sugar ranges: does not have a meter at home for the last week  Polyuria: no Polydipsia: no Vision changes: no Hypoglycemic symptoms: yes occur in the mornings; improve with food  Eye exam: has appt at [facility] coming up Microalbumin: n/a on ACE-i  Tobacco abuse She states that she had quit for a year previously, but started up again when she and her daughter moved.  She knows she needs to quit - she has noticed that her daughter has nasal symptoms when she smokes.  I have reviewed and updated the following as appropriate: allergies and current medications SHx: current smoker  Review of Systems See HPI    Objective:   Physical Exam BP 130/90  Pulse 81  Temp(Src) 98.2 F (36.8 C) (Oral)  Ht 5\' 11"  (1.803 m)  Wt 185 lb (83.915 kg)  BMI 25.81 kg/m2  LMP 08/24/2013 Gen: alert, cooperative, NAD HEENT: AT/[place], sclera white, MMM Neck: supple, no LAD  CV: RRR, no murmurs Pulm: CTAB, no wheezes or rales Ext: no edema     Assessment & Plan:

## 2013-10-29 NOTE — Assessment & Plan Note (Signed)
Knows she need to quit. Has quit for a year previously. Provided encouragement.

## 2013-10-29 NOTE — Assessment & Plan Note (Signed)
Well controlled  Continue lisinopril 10mg daily

## 2013-10-29 NOTE — Patient Instructions (Signed)
It was nice to see you!  Your blood pressure is excellent. Your diabetes is getting much better.  Please check your sugar first thing in the morning and 2 hours after a meal.  Keep track of these numbers. Start taking the Lantus in the morning instead of at night.  If you still have low sugars in the morning cut it back to 24 units.  Follow up in 1 month to review your numbers and adjust your insulin.

## 2013-10-29 NOTE — Assessment & Plan Note (Signed)
A1c improved today at 9.7%. Will order glucometer for her and refill her medications. She will start taking her Lantus in the morning to try and help with morning lows.  If they are persistent, she will decrease Lantus to 24 units. She will check a fasting sugar and 2 hour post prandial sugar daily. Follow up in 1 month to review log and adjust insulin.

## 2014-01-27 ENCOUNTER — Telehealth: Payer: Self-pay | Admitting: *Deleted

## 2014-01-27 NOTE — Telephone Encounter (Signed)
LMVM for patient to schedule DM check up.  Patricia Charles, [name], CMA

## 2014-02-11 ENCOUNTER — Encounter (HOSPITAL_COMMUNITY): Payer: Self-pay | Admitting: Emergency Medicine

## 2014-02-11 ENCOUNTER — Emergency Department (HOSPITAL_COMMUNITY)
Admission: EM | Admit: 2014-02-11 | Discharge: 2014-02-11 | Discharge status: Against Medical Advice | Payer: Medicaid Other | Attending: Emergency Medicine | Admitting: Emergency Medicine

## 2014-02-11 DIAGNOSIS — F3289 Other specified depressive episodes: Secondary | ICD-10-CM | POA: Insufficient documentation

## 2014-02-11 DIAGNOSIS — I10 Essential (primary) hypertension: Secondary | ICD-10-CM | POA: Insufficient documentation

## 2014-02-11 DIAGNOSIS — E1110 Type 2 diabetes mellitus with ketoacidosis without coma: Secondary | ICD-10-CM

## 2014-02-11 DIAGNOSIS — Z794 Long term (current) use of insulin: Secondary | ICD-10-CM | POA: Insufficient documentation

## 2014-02-11 DIAGNOSIS — R5381 Other malaise: Secondary | ICD-10-CM | POA: Insufficient documentation

## 2014-02-11 DIAGNOSIS — R5383 Other fatigue: Secondary | ICD-10-CM

## 2014-02-11 DIAGNOSIS — F411 Generalized anxiety disorder: Secondary | ICD-10-CM | POA: Insufficient documentation

## 2014-02-11 DIAGNOSIS — R358 Other polyuria: Secondary | ICD-10-CM | POA: Insufficient documentation

## 2014-02-11 DIAGNOSIS — E119 Type 2 diabetes mellitus without complications: Secondary | ICD-10-CM

## 2014-02-11 DIAGNOSIS — R3589 Other polyuria: Secondary | ICD-10-CM | POA: Insufficient documentation

## 2014-02-11 DIAGNOSIS — Z8619 Personal history of other infectious and parasitic diseases: Secondary | ICD-10-CM | POA: Insufficient documentation

## 2014-02-11 DIAGNOSIS — Z3202 Encounter for pregnancy test, result negative: Secondary | ICD-10-CM | POA: Insufficient documentation

## 2014-02-11 DIAGNOSIS — R42 Dizziness and giddiness: Secondary | ICD-10-CM | POA: Insufficient documentation

## 2014-02-11 DIAGNOSIS — IMO0001 Reserved for inherently not codable concepts without codable children: Secondary | ICD-10-CM

## 2014-02-11 DIAGNOSIS — F17200 Nicotine dependence, unspecified, uncomplicated: Secondary | ICD-10-CM | POA: Insufficient documentation

## 2014-02-11 DIAGNOSIS — E860 Dehydration: Secondary | ICD-10-CM | POA: Insufficient documentation

## 2014-02-11 DIAGNOSIS — R000 Tachycardia, unspecified: Secondary | ICD-10-CM | POA: Insufficient documentation

## 2014-02-11 DIAGNOSIS — F329 Major depressive disorder, single episode, unspecified: Secondary | ICD-10-CM | POA: Insufficient documentation

## 2014-02-11 DIAGNOSIS — Z79899 Other long term (current) drug therapy: Secondary | ICD-10-CM | POA: Insufficient documentation

## 2014-02-11 LAB — COMPREHENSIVE METABOLIC PANEL
ALBUMIN: 4.1 g/dL (ref 3.5–5.2)
ALT: 20 U/L (ref 0–35)
AST: 15 U/L (ref 0–37)
Alkaline Phosphatase: 133 U/L — ABNORMAL HIGH (ref 39–117)
BUN: 26 mg/dL — ABNORMAL HIGH (ref 6–23)
CALCIUM: 10.5 mg/dL (ref 8.4–10.5)
CO2: 24 meq/L (ref 19–32)
Chloride: 88 mEq/L — ABNORMAL LOW (ref 96–112)
Creatinine, Ser: 0.89 mg/dL (ref 0.50–1.10)
GFR calc Af Amer: 89 mL/min — ABNORMAL LOW (ref >90)
GFR calc non Af Amer: 77 mL/min — ABNORMAL LOW (ref >90)
Glucose, Bld: 479 mg/dL — ABNORMAL HIGH (ref 70–99)
Potassium: 5 mEq/L (ref 3.7–5.3)
SODIUM: 132 meq/L — AB (ref 137–147)
Total Bilirubin: 0.6 mg/dL (ref 0.3–1.2)
Total Protein: 8.9 g/dL — ABNORMAL HIGH (ref 6.0–8.3)

## 2014-02-11 LAB — URINALYSIS, ROUTINE W REFLEX MICROSCOPIC
Bilirubin Urine: NEGATIVE
Ketones, ur: 15 mg/dL — AB
Leukocytes, UA: NEGATIVE
Nitrite: NEGATIVE
PH: 5 (ref 5.0–8.0)
Protein, ur: NEGATIVE mg/dL
SPECIFIC GRAVITY, URINE: 1.037 — AB (ref 1.005–1.030)
Urobilinogen, UA: 1 mg/dL (ref 0.0–1.0)

## 2014-02-11 LAB — URINE MICROSCOPIC-ADD ON

## 2014-02-11 LAB — CBC
HCT: 48.2 % — ABNORMAL HIGH (ref 36.0–46.0)
Hemoglobin: 17.3 g/dL — ABNORMAL HIGH (ref 12.0–15.0)
MCH: 29.5 pg (ref 26.0–34.0)
MCHC: 35.9 g/dL (ref 30.0–36.0)
MCV: 82.1 fL (ref 78.0–100.0)
PLATELETS: 208 10*3/uL (ref 150–400)
RBC: 5.87 MIL/uL — AB (ref 3.87–5.11)
RDW: 13.4 % (ref 11.5–15.5)
WBC: 6.2 10*3/uL (ref 4.0–10.5)

## 2014-02-11 LAB — PREGNANCY, URINE: Preg Test, Ur: NEGATIVE

## 2014-02-11 LAB — CBG MONITORING, ED: Glucose-Capillary: 464 mg/dL — ABNORMAL HIGH (ref 70–99)

## 2014-02-11 MED ORDER — SODIUM CHLORIDE 0.9 % IV BOLUS (SEPSIS): 2000.0000 mL | Freq: Once | INTRAVENOUS | Status: DC

## 2014-02-11 MED ORDER — SODIUM CHLORIDE 0.9 % IV SOLN
INTRAVENOUS | Status: DC
  Filled 2014-02-11: qty 1

## 2014-02-11 MED ORDER — SODIUM CHLORIDE 0.9 % IV BOLUS (SEPSIS): 1000.0000 mL | Freq: Once | INTRAVENOUS | Status: DC

## 2014-02-11 NOTE — ED Notes (Signed)
Attempted 2 IV placements without success; will call another nurse to attempt; pt resting/watching TV

## 2014-02-11 NOTE — ED Notes (Addendum)
Pt leaving because she states she is tired of getting stuck and she can take her own insulin at home; explained to pt that her current medications of glucophage and Lantis will bring her blood sugar down immediately, so she will continue to feel bad for a while; pt understood/then advised me that she had a presciption for Novalog/Humalog and that she was instructed by a physician to take 6 units and that she would just deal with that;  She thought there was a financial advantage to coming to the ED; however, she will fill her prescription and go home.  I stated for her to come back if she got any worse.

## 2014-02-11 NOTE — ED Provider Notes (Signed)
Medical screening examination/treatment/procedure(s) were performed by non-physician practitioner and as supervising physician I was immediately available for consultation/collaboration.     Mittie Knittel, MD 02/11/14 2003 

## 2014-02-11 NOTE — ED Provider Notes (Signed)
CSN: [id number]     Arrival date & time 02/11/14  1117 History   First MD Initiated Contact with Patient 02/11/14 1143     Chief Complaint  Patient presents with  . Diabetes     (Consider location/radiation/quality/duration/timing/severity/associated sxs/prior Treatment) Patient is a 48 y.o. female presenting with diabetes problem. The history is provided by the patient and medical records. No language interpreter was used.  Diabetes Associated symptoms include fatigue. Pertinent negatives include no abdominal pain, chest pain, coughing, diaphoresis, fever, headaches, nausea, rash or vomiting.    Patricia Charles is a 48 y.o. female  with a hx of IDDM, HTN, polysubstance abuse presents to the Emergency Department complaining of gradual, persistent, progressively worsening feelings of dehydration onset 3 days ago. Patient reports she does not have a meter at home and has not been checking her blood sugars but has been taking her medications as directed.  She denies illicit substance abuse at this time.  She relates polyuria but no polydipsia or polyphagia. She denies fever chills, nausea vomiting, diarrhea. She reports fatigue and lightheadedness but no dizziness or syncope. She reports being unable to see her primary care this week due to snow days with her children. Nothing makes the symptoms better or worse.  She also complains of "peeling skin" on the soles of her feet, but denies pain or itching at the site. Patient reports use of new body oil in the bath on Monday however she's had no itching or peeling of skin anywhere else on her body.  Past Medical History  Diagnosis Date  . Diabetes mellitus     A1C 15.8   12/17/12  . Irregular heart beat   . Depression     Was taking abilify, celexa  . Herpes     Takes acyclovir BID  . Narcotic abuse     Attends NA mtgs  . Hypertension   . Anxiety   . Smoker   . Polysubstance abuse     (+) cocaine 12/15/12   Past Surgical History  Procedure  Laterality Date  . Tubal ligation     Family History  Problem Relation Age of Onset  . Heart disease Mother   . Hypertension Mother   . Coronary artery disease Mother    History  Substance Use Topics  . Smoking status: Current Every Day Smoker -- 25 years    Types: Cigarettes    Last Attempt to Quit: 12/04/2010  . Smokeless tobacco: Never Used  . Alcohol Use: No     Comment: drank when she used   OB History   Grav Para Term Preterm Abortions TAB SAB Ect Mult Living   4 2 0  $'2 1 1   2     'v$ Review of Systems  Constitutional: Positive for fatigue. Negative for fever, diaphoresis, appetite change and unexpected weight change.  HENT: Negative for mouth sores.   Eyes: Negative for visual disturbance.  Respiratory: Negative for cough, chest tightness, shortness of breath and wheezing.   Cardiovascular: Negative for chest pain.  Gastrointestinal: Negative for nausea, vomiting, abdominal pain, diarrhea and constipation.  Endocrine: Positive for polyuria. Negative for polydipsia and polyphagia.  Genitourinary: Negative for dysuria, urgency, frequency and hematuria.  Musculoskeletal: Negative for back pain and neck stiffness.  Skin: Negative for rash.       Peeling skin  Allergic/Immunologic: Negative for immunocompromised state.  Neurological: Negative for syncope, light-headedness and headaches.  Hematological: Does not bruise/bleed easily.  Psychiatric/Behavioral: Negative for sleep disturbance. The patient  is not nervous/anxious.       Allergies  Review of patient's allergies indicates no known allergies.  Home Medications   Current Outpatient Rx  Name  Route  Sig  Dispense  Refill  . acyclovir (ZOVIRAX) 400 MG tablet   Oral   Take 1 tablet (400 mg total) by mouth 2 (two) times daily.   60 tablet   11   . gabapentin (NEURONTIN) 300 MG capsule   Oral   Take 1 capsule (300 mg total) by mouth 3 (three) times daily.   90 capsule   11     Patient needs appt with  PCP.   Marland Kitchen insulin glargine (LANTUS) 100 UNIT/ML injection   Subcutaneous   Inject 0.26 mLs (26 Units total) into the skin every morning.   3 pen   11     Please dispense SOLOSTAR PENS   . insulin lispro (HUMALOG) 100 UNIT/ML injection   Subcutaneous   Inject 6 Units into the skin 3 (three) times daily with meals.         . metFORMIN (GLUCOPHAGE-XR) 750 MG 24 hr tablet   Oral   Take 2 tablets (1,500 mg total) by mouth daily with supper.   60 tablet   11   . ACCU-CHEK FASTCLIX LANCETS MISC   Does not apply   1 Device by Does not apply route 3 (three) times daily.   100 each   11   . Blood Glucose Monitoring Suppl (ACCU-CHEK NANO SMARTVIEW) W/DEVICE KIT   Does not apply   1 Device by Does not apply route once.   1 kit   0   . glucose blood (ACCU-CHEK SMARTVIEW) test strip      Test 2-3 times daily   100 each   12    BP 121/86  Pulse 86  Temp(Src) 98 F (36.7 C) (Oral)  Resp 16  Wt 213 lb (96.616 kg)  SpO2 96% Physical Exam  Nursing note and vitals reviewed. Constitutional: She is oriented to person, place, and time. She appears well-developed and well-nourished. No distress.  Awake, alert, nontoxic appearance  HENT:  Head: Normocephalic and atraumatic.  Mouth/Throat: Oropharynx is clear and moist. No oropharyngeal exudate.  Eyes: Conjunctivae are normal. No scleral icterus.  Neck: Normal range of motion. Neck supple.  Cardiovascular: Regular rhythm, normal heart sounds and intact distal pulses.   Mild tachycardia  Pulmonary/Chest: Effort normal and breath sounds normal. No respiratory distress. She has no wheezes. She has no rales.  Clear and equal breath sounds  Abdominal: Soft. Bowel sounds are normal. She exhibits no mass. There is no tenderness. There is no rebound and no guarding.  Soft and nontender  Musculoskeletal: Normal range of motion. She exhibits no edema.  Neurological: She is alert and oriented to person, place, and time.  Speech is clear  and goal oriented Moves extremities without ataxia  Skin: Skin is warm and dry. She is not diaphoretic. No erythema.  Skin peeling off of the soles of her feet  no erythema, induration or evidence of abscess; no pain at the site  Psychiatric: She has a normal mood and affect.    ED Course  Procedures (including critical care time) Labs Review Labs Reviewed  CBC - Abnormal; Notable for the following:    RBC 5.87 (*)    Hemoglobin 17.3 (*)    HCT 48.2 (*)    All other components within normal limits  COMPREHENSIVE METABOLIC PANEL - Abnormal; Notable for the following:  Sodium 132 (*)    Chloride 88 (*)    Glucose, Bld 479 (*)    BUN 26 (*)    Total Protein 8.9 (*)    Alkaline Phosphatase 133 (*)    GFR calc non Af Amer 77 (*)    GFR calc Af Amer 89 (*)    All other components within normal limits  URINALYSIS, ROUTINE W REFLEX MICROSCOPIC - Abnormal; Notable for the following:    APPearance HAZY (*)    Specific Gravity, Urine 1.037 (*)    Glucose, UA >1000 (*)    Hgb urine dipstick SMALL (*)    Ketones, ur 15 (*)    All other components within normal limits  URINE MICROSCOPIC-ADD ON - Abnormal; Notable for the following:    Squamous Epithelial / LPF MANY (*)    Bacteria, UA FEW (*)    All other components within normal limits  CBG MONITORING, ED - Abnormal; Notable for the following:    Glucose-Capillary 464 (*)    All other components within normal limits  PREGNANCY, URINE   Imaging Review No results found.  EKG Interpretation   None       MDM   Final diagnoses:  DKA (diabetic ketoacidoses)  IDDM (insulin dependent diabetes mellitus)  Dehydration   Patricia Charles presents with hyperglycemia and feelings of being dehydrated. Will obtain basic blood work and give fluid bolus. Patient has not been checking her blood sugar at home but sees family practice is her primary care. We'll assess for DKA.   1:25 PM Pt with AG 20 and keytones in the urine; Pt in DKA.   Will give fluids, glucostabilizer and plan for admission.  Discussed with patient who is agreeable to admission.    2:00PM  IV team attempting IV. Pt continues to report feeling poorly.   2:33 PM Discussed with family medicine who agreed to admit.  I went to inform the patient of this she was gone from the room.  RN reports that patient did not want to stay and had insulin prescription at the pharmacy at home. She was tachycardic, hypertensive here in the emergency department with an anion gap. I was not informed of this before the patient left and did not have an opportunity to discuss Simpson discharge.  Patient was alert and oriented and appeared competent to make this decision however I was unable to discuss the risk of her before she looked from department.   I discussed this with family medicine who states we should consult them if she returns.  The patient was discussed with Dr. Stark Jock who is aware of the situation.  Jarrett Soho Demetrius Barrell, PA-C 02/11/14 1437

## 2014-02-11 NOTE — ED Notes (Signed)
Nilda CalamityBrittany C, RN attempted IV insertion without success; IV team paged and responded/stated that it would be a half hour; provider notified

## 2014-02-11 NOTE — ED Notes (Signed)
Pt states " i think i need IV fluids, i feel im dehydrated, i feel dry.' states she has diabetes and she feels that is why she is dehydrated. She did not check her blood sugar today. She denies vomiting or pain.

## 2014-05-14 ENCOUNTER — Encounter: Payer: Self-pay | Admitting: Family Medicine

## 2014-05-14 ENCOUNTER — Ambulatory Visit (INDEPENDENT_AMBULATORY_CARE_PROVIDER_SITE_OTHER): Payer: Medicaid Other | Admitting: Family Medicine

## 2014-05-14 VITALS — BP 125/80 | HR 80 | Temp 98.4°F | Ht 71.0 in | Wt 231.0 lb

## 2014-05-14 DIAGNOSIS — R5383 Other fatigue: Secondary | ICD-10-CM

## 2014-05-14 DIAGNOSIS — R52 Pain, unspecified: Secondary | ICD-10-CM

## 2014-05-14 DIAGNOSIS — S90569A Insect bite (nonvenomous), unspecified ankle, initial encounter: Secondary | ICD-10-CM

## 2014-05-14 DIAGNOSIS — W57XXXA Bitten or stung by nonvenomous insect and other nonvenomous arthropods, initial encounter: Principal | ICD-10-CM

## 2014-05-14 DIAGNOSIS — R5381 Other malaise: Secondary | ICD-10-CM

## 2014-05-14 DIAGNOSIS — S70269A Insect bite (nonvenomous), unspecified hip, initial encounter: Secondary | ICD-10-CM

## 2014-05-14 MED ORDER — DOXYCYCLINE HYCLATE 100 MG PO TABS: 100.0000 mg | ORAL_TABLET | Freq: Two times a day (BID) | ORAL | Status: DC

## 2014-05-14 NOTE — Progress Notes (Signed)
   Subjective:    Patient ID: Patricia Charles, female    DOB: Feb 27, 1966, 48 y.o.   MRN: [id number]  HPI: Pt presents to clinic for SDA for a tick bite. She states she noticed it on 5/24 and pulled it off; she is unsure how long it was on her. She has had a small bump on her left thigh where the tick was, which has persisted and is starting to get sore; she thinks part of the tick is stil in the lesion. She has a dog but has not been out in the woods, herself. She does work at [employer], which is in a woody area. She has had no rash, no fevers, but she does have some body aches and joint aches and tiredness. She normally has a 20-minute walk from the bus stop after her work, but has not been able to walk as easily as normal. She has had no belly pain, N/V, but does have some loose stools. She has been eating and drinking like normal.  Review of Systems: As above.     Objective:   Physical Exam BP 125/80  Pulse 80  Temp(Src) 98.4 F (36.9 C) (Oral)  Ht 5\' 11"  (1.803 m)  Wt 231 lb (104.781 kg)  BMI 32.23 kg/m2 Gen: well-appearing adult female, in NAD Skin: no diffuse rashes; small papular lesion to anterior left hip  Lesion approx 0.5 cm diameter with black, sub-millimeter central tissue  Tissue initially appeared to be eschar, but closer inspection revealed head of tick still present Abd: soft, nontender, nondistended Neuro: alert / oriented, no gross focal deficit, no sensory deficit  Changes positions / stands / ambulates without assistance MSK: no frank deformity or joint effusions throughout all extremities Ext: warm, well-perfused, no LE edema     Assessment & Plan:  Tick bite A: Tick bite happened sometime before 5/24, with head still present in lesion on 5/28 (today). No definite rash or fevers, but some headaches, tiredness, and vague bodyaches.  P: Tick head removed with simple sharp dissection (see below) without complication; Bacitracin and bandage applied. Rx for  doxycycline 100 mg BID for 10 days given history and systemic complaints, though expect pt does not have an infectious process going on. Instructed to watch for worsening symptoms, high fever, rash, or infection in / around site of bite. F/u with PCP as needed.  Tick removal: Verbal consent obtained. Skin lesion described above cleaned with iodine swab and alcohol. Skin anesthetized with ethyl chloride spray. Sharp dissection used to remove tick head with very tip of #11 scalpel blade (sub-millimeter incision in upper layer of skin with scalpel tip used to pry out tick head). No immediate complication. Negligible blood loss (<1 drop of blood). Lesion covered with Bacitracin ointment and adhesive bandage. Aftercare and red flag instructions provided.

## 2014-05-14 NOTE — Patient Instructions (Signed)
Thank you for coming in, today!  I removed a small piece of the tick, today. The area does not look infected. You can clean the area with soap and water and cover it with antibiotic ointment and bandaid.  Since we don't know what type or how the tick was there and because the head was still there, we'll put you on antibiotics for 10 days. Take doxycline twice a day, with food and water until you finish all 20 pills.  Come back as you need. If you have body aches, pains, etc, you can take Tylenol as needed. If your symptoms get worse, or if you have a bad rash come up, come back to be checked out again. Please feel free to call with any questions or concerns at any time, at [phone]. --Dr. Casper Harrison

## 2014-06-01 ENCOUNTER — Ambulatory Visit (INDEPENDENT_AMBULATORY_CARE_PROVIDER_SITE_OTHER): Payer: Medicaid Other | Admitting: *Deleted

## 2014-06-01 DIAGNOSIS — Z111 Encounter for screening for respiratory tuberculosis: Secondary | ICD-10-CM

## 2014-06-03 ENCOUNTER — Ambulatory Visit (INDEPENDENT_AMBULATORY_CARE_PROVIDER_SITE_OTHER): Payer: Medicaid Other | Admitting: *Deleted

## 2014-06-03 ENCOUNTER — Encounter: Payer: Self-pay | Admitting: *Deleted

## 2014-06-03 DIAGNOSIS — Z111 Encounter for screening for respiratory tuberculosis: Secondary | ICD-10-CM

## 2014-06-03 LAB — TB SKIN TEST
Induration: 0 mm
TB Skin Test: NEGATIVE

## 2014-10-19 ENCOUNTER — Encounter: Payer: Self-pay | Admitting: Family Medicine

## 2014-12-08 ENCOUNTER — Emergency Department (HOSPITAL_COMMUNITY): Payer: Medicaid Other

## 2014-12-08 ENCOUNTER — Emergency Department (HOSPITAL_COMMUNITY)
Admission: EM | Admit: 2014-12-08 | Discharge: 2014-12-08 | Disposition: A | Discharge status: Home / Self Care | Payer: Medicaid Other | Attending: Emergency Medicine | Admitting: Emergency Medicine

## 2014-12-08 ENCOUNTER — Encounter (HOSPITAL_COMMUNITY): Payer: Self-pay | Admitting: Cardiology

## 2014-12-08 DIAGNOSIS — F419 Anxiety disorder, unspecified: Secondary | ICD-10-CM | POA: Diagnosis not present

## 2014-12-08 DIAGNOSIS — S161XXA Strain of muscle, fascia and tendon at neck level, initial encounter: Secondary | ICD-10-CM

## 2014-12-08 DIAGNOSIS — Y998 Other external cause status: Secondary | ICD-10-CM | POA: Diagnosis not present

## 2014-12-08 DIAGNOSIS — Z794 Long term (current) use of insulin: Secondary | ICD-10-CM | POA: Diagnosis not present

## 2014-12-08 DIAGNOSIS — Y92481 Parking lot as the place of occurrence of the external cause: Secondary | ICD-10-CM | POA: Diagnosis not present

## 2014-12-08 DIAGNOSIS — Y9389 Activity, other specified: Secondary | ICD-10-CM | POA: Insufficient documentation

## 2014-12-08 DIAGNOSIS — E119 Type 2 diabetes mellitus without complications: Secondary | ICD-10-CM | POA: Insufficient documentation

## 2014-12-08 DIAGNOSIS — I10 Essential (primary) hypertension: Secondary | ICD-10-CM | POA: Diagnosis not present

## 2014-12-08 DIAGNOSIS — Z8619 Personal history of other infectious and parasitic diseases: Secondary | ICD-10-CM | POA: Insufficient documentation

## 2014-12-08 DIAGNOSIS — F329 Major depressive disorder, single episode, unspecified: Secondary | ICD-10-CM | POA: Diagnosis not present

## 2014-12-08 DIAGNOSIS — Z720 Tobacco use: Secondary | ICD-10-CM | POA: Diagnosis not present

## 2014-12-08 DIAGNOSIS — S299XXA Unspecified injury of thorax, initial encounter: Secondary | ICD-10-CM | POA: Diagnosis not present

## 2014-12-08 DIAGNOSIS — Z79899 Other long term (current) drug therapy: Secondary | ICD-10-CM | POA: Insufficient documentation

## 2014-12-08 DIAGNOSIS — S199XXA Unspecified injury of neck, initial encounter: Secondary | ICD-10-CM | POA: Diagnosis present

## 2014-12-08 DIAGNOSIS — Z792 Long term (current) use of antibiotics: Secondary | ICD-10-CM | POA: Insufficient documentation

## 2014-12-08 DIAGNOSIS — M549 Dorsalgia, unspecified: Secondary | ICD-10-CM

## 2014-12-08 MED ORDER — HYDROCODONE-ACETAMINOPHEN 5-325 MG PO TABS: 1.0000 | ORAL_TABLET | Freq: Four times a day (QID) | ORAL | Status: DC | PRN

## 2014-12-08 MED ORDER — OXYCODONE-ACETAMINOPHEN 5-325 MG PO TABS
1.0000 | ORAL_TABLET | Freq: Once | ORAL | Status: AC
  Administered 2014-12-08: 1 via ORAL
  Filled 2014-12-08: qty 1

## 2014-12-08 MED ORDER — CYCLOBENZAPRINE HCL 10 MG PO TABS: 10.0000 mg | ORAL_TABLET | Freq: Every day | ORAL | Status: AC

## 2014-12-08 MED ORDER — DIPHENHYDRAMINE HCL 25 MG PO CAPS
25.0000 mg | ORAL_CAPSULE | Freq: Once | ORAL | Status: AC
  Administered 2014-12-08: 25 mg via ORAL
  Filled 2014-12-08: qty 1
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
## 2014-12-08 NOTE — Discharge Instructions (Signed)
Call for a follow up appointment with a Family or Primary Care Provider.  Return if Symptoms worsen.   Take medication as prescribed.  Ice your neck and back 3-4 times a day. Do some gentle stretching and range of motion to reduce pain in your neck.
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
## 2014-12-22 ENCOUNTER — Ambulatory Visit (INDEPENDENT_AMBULATORY_CARE_PROVIDER_SITE_OTHER): Payer: Medicaid Other | Admitting: Family Medicine

## 2014-12-22 ENCOUNTER — Encounter: Payer: Self-pay | Admitting: Family Medicine

## 2014-12-22 VITALS — BP 124/77 | HR 112 | Temp 98.3°F | Ht 71.0 in | Wt 212.0 lb

## 2014-12-22 DIAGNOSIS — N898 Other specified noninflammatory disorders of vagina: Secondary | ICD-10-CM | POA: Insufficient documentation

## 2014-12-22 DIAGNOSIS — A5901 Trichomonal vulvovaginitis: Secondary | ICD-10-CM

## 2014-12-22 DIAGNOSIS — B9689 Other specified bacterial agents as the cause of diseases classified elsewhere: Secondary | ICD-10-CM

## 2014-12-22 DIAGNOSIS — A499 Bacterial infection, unspecified: Secondary | ICD-10-CM

## 2014-12-22 DIAGNOSIS — L298 Other pruritus: Secondary | ICD-10-CM

## 2014-12-22 DIAGNOSIS — N760 Acute vaginitis: Secondary | ICD-10-CM

## 2014-12-22 LAB — POCT WET PREP (WET MOUNT): CLUE CELLS WET PREP WHIFF POC: POSITIVE

## 2014-12-22 MED ORDER — FLUCONAZOLE 150 MG PO TABS: 150.0000 mg | ORAL_TABLET | Freq: Once | ORAL | Status: DC

## 2014-12-22 MED ORDER — METRONIDAZOLE 500 MG PO TABS: 500.0000 mg | ORAL_TABLET | Freq: Two times a day (BID) | ORAL | Status: DC

## 2014-12-22 NOTE — Assessment & Plan Note (Signed)
Wet prep consistent with BV and Trichomoniasis, likely etiology of symptoms started with Trich, and suspect BV developed following recent douching to cleanse  Plan: 1. Wet prep - +many clue cells, +whiff, +trich, copious thin discharge 2. Treat BV and Trich with Metronidazole 500mg BID x 7 days, advised no alcohol on med 3. Also per pt request, rx Diflucan 150mg (#2 on day 4) for potential yeast infection following Metronidazole treatment 4. Advised no sexual intercourse during treatment, to contact prior sexual partner and have them seek treatment, encouraged future condom usage 5. RTC 2 weeks if not resolved or worsening, consider future STD testing as needed 

## 2014-12-22 NOTE — Progress Notes (Signed)
   Subjective:    Patient ID: Patricia LeitzShountina Charles, female    DOB: 12/26/1965, 49 y.o.   MRN: [id number]  Patient presents for a same day appointment.  HPI  VAGINAL ITCHING / VAGINAL DISCHARGE: - Reports complaint of vaginal itching, initially mild but was not improving, her mother gave her a "medicated douche" about 2 weeks ago, seemed to irritate her more and cause progressive worsening and then seemed to have some vaginal discharge. - Last sexual intercourse 2-3 months ago, not concerned about an STI - Additionally admits hx elevated CBGs recently - Denies fevers/chills, dysuria, foul odor, rash, abdominal / pelvic pain  I have reviewed and updated the following as appropriate: allergies and current medications  Social Hx: - Active smoker  Review of Systems  See above HPI    Objective:   Physical Exam  BP 124/77 mmHg  Pulse 112  Temp(Src) 98.3 F (36.8 C) (Oral)  Ht 5\' 11"  (1.803 m)  Wt 212 lb (96.163 kg)  BMI 29.58 kg/m2  LMP 11/17/2014  Gen - well-appearing, NAD HEENT - MMM Skin - warm, dry, no rashes Pelvic Exam - Normal external female genitalia without rash or lesions. Vaginal canal normal appearing. Normal appearing cervix, without lesions or bleeding. Increased thin discharge on exam. Bimanual exam without masses or cervical motion tenderness.     Assessment & Plan:   See specific A&P problem list for details.

## 2014-12-22 NOTE — Patient Instructions (Signed)
Dear Patricia Charles, Thank you for coming in to clinic today.  1. The results of your Wet Prep test today showed - 1) Bacterial Vaginosis and 2) Trichomoniasis infection - Take Metronidazole (Flagyl) 500mg  twice daily for 7 days - avoid alcohol while on this medication (cause nausea and vomiting) 2. BV - caused by an imbalance, likely by the douching episode triggered this one 3. Your itching was most likely initially caused by Trichomoniasis infection, this is sexually transmitted - recommend contacting recent partners to let them know you tested positive, and advise them to seek treatment. Important to avoid sexual intercourse while on treatment (at least 10 days), and then no intercourse with potentially infected partner until they have also completed treatment.  Please schedule a follow-up appointment with Dr. Pollie MeyerMcIntyre within 1-2 weeks if no improvement, otherwise, please schedule regular follow-up for other medical concerns, and please discuss potential future STD testing if needed.  If you have any other questions or concerns, please feel free to call the clinic to contact me. You may also schedule an earlier appointment if necessary.  However, if your symptoms get significantly worse, please go to the Emergency Department to seek immediate medical attention.  Saralyn PilarAlexander Ocia Simek, DO [facility]

## 2014-12-22 NOTE — Assessment & Plan Note (Signed)
Wet prep consistent with BV and Trichomoniasis, likely etiology of symptoms started with Trich, and suspect BV developed following recent douching to cleanse  Plan: 1. Wet prep - +many clue cells, +whiff, +trich, copious thin discharge 2. Treat BV and Trich with Metronidazole 500mg  BID x 7 days, advised no alcohol on med 3. Also per pt request, rx Diflucan 150mg  (#2 on day 4) for potential yeast infection following Metronidazole treatment 4. Advised no sexual intercourse during treatment, to contact prior sexual partner and have them seek treatment, encouraged future condom usage 5. RTC 2 weeks if not resolved or worsening, consider future STD testing as needed

## 2014-12-23 ENCOUNTER — Other Ambulatory Visit: Payer: Self-pay | Admitting: Family Medicine

## 2014-12-23 DIAGNOSIS — E1140 Type 2 diabetes mellitus with diabetic neuropathy, unspecified: Secondary | ICD-10-CM

## 2014-12-23 DIAGNOSIS — B009 Herpesviral infection, unspecified: Secondary | ICD-10-CM

## 2014-12-23 DIAGNOSIS — E138 Other specified diabetes mellitus with unspecified complications: Secondary | ICD-10-CM

## 2014-12-23 NOTE — Telephone Encounter (Signed)
Pt called and needs refills on her Gabapentin and Lantus solostar called in. jw

## 2014-12-24 ENCOUNTER — Emergency Department (HOSPITAL_COMMUNITY)
Admission: EM | Admit: 2014-12-24 | Discharge: 2014-12-24 | Disposition: A | Discharge status: Home / Self Care | Payer: Medicaid Other | Attending: Emergency Medicine | Admitting: Emergency Medicine

## 2014-12-24 DIAGNOSIS — I10 Essential (primary) hypertension: Secondary | ICD-10-CM | POA: Insufficient documentation

## 2014-12-24 DIAGNOSIS — R739 Hyperglycemia, unspecified: Secondary | ICD-10-CM

## 2014-12-24 DIAGNOSIS — Z720 Tobacco use: Secondary | ICD-10-CM | POA: Diagnosis not present

## 2014-12-24 DIAGNOSIS — R110 Nausea: Secondary | ICD-10-CM | POA: Diagnosis not present

## 2014-12-24 DIAGNOSIS — F419 Anxiety disorder, unspecified: Secondary | ICD-10-CM | POA: Diagnosis not present

## 2014-12-24 DIAGNOSIS — E119 Type 2 diabetes mellitus without complications: Secondary | ICD-10-CM

## 2014-12-24 DIAGNOSIS — Z8619 Personal history of other infectious and parasitic diseases: Secondary | ICD-10-CM | POA: Insufficient documentation

## 2014-12-24 DIAGNOSIS — R5383 Other fatigue: Secondary | ICD-10-CM | POA: Diagnosis not present

## 2014-12-24 DIAGNOSIS — Z794 Long term (current) use of insulin: Secondary | ICD-10-CM | POA: Insufficient documentation

## 2014-12-24 DIAGNOSIS — Z3202 Encounter for pregnancy test, result negative: Secondary | ICD-10-CM | POA: Diagnosis not present

## 2014-12-24 DIAGNOSIS — E1165 Type 2 diabetes mellitus with hyperglycemia: Secondary | ICD-10-CM | POA: Insufficient documentation

## 2014-12-24 DIAGNOSIS — Z79899 Other long term (current) drug therapy: Secondary | ICD-10-CM | POA: Insufficient documentation

## 2014-12-24 LAB — CBC WITH DIFFERENTIAL/PLATELET
Basophils Absolute: 0 10*3/uL (ref 0.0–0.1)
Basophils Relative: 0 % (ref 0–1)
Eosinophils Absolute: 0.1 10*3/uL (ref 0.0–0.7)
Eosinophils Relative: 2 % (ref 0–5)
HCT: 47.6 % — ABNORMAL HIGH (ref 36.0–46.0)
Hemoglobin: 16.6 g/dL — ABNORMAL HIGH (ref 12.0–15.0)
Lymphocytes Relative: 45 % (ref 12–46)
Lymphs Abs: 3 10*3/uL (ref 0.7–4.0)
MCH: 29.4 pg (ref 26.0–34.0)
MCHC: 34.9 g/dL (ref 30.0–36.0)
MCV: 84.4 fL (ref 78.0–100.0)
Monocytes Absolute: 0.4 10*3/uL (ref 0.1–1.0)
Monocytes Relative: 6 % (ref 3–12)
Neutro Abs: 3.2 10*3/uL (ref 1.7–7.7)
Neutrophils Relative %: 47 % (ref 43–77)
Platelets: 217 10*3/uL (ref 150–400)
RBC: 5.64 MIL/uL — ABNORMAL HIGH (ref 3.87–5.11)
RDW: 14.3 % (ref 11.5–15.5)
WBC: 6.8 10*3/uL (ref 4.0–10.5)

## 2014-12-24 LAB — URINALYSIS, ROUTINE W REFLEX MICROSCOPIC
Bilirubin Urine: NEGATIVE
Glucose, UA: >1000 mg/dL — AB
Ketones, ur: 15 mg/dL — AB
Leukocytes, UA: NEGATIVE
Nitrite: NEGATIVE
Protein, ur: NEGATIVE mg/dL
Specific Gravity, Urine: 1.039 — ABNORMAL HIGH (ref 1.005–1.030)
Urobilinogen, UA: 0.2 mg/dL (ref 0.0–1.0)
pH: 5 (ref 5.0–8.0)

## 2014-12-24 LAB — COMPREHENSIVE METABOLIC PANEL
ALT: 16 U/L (ref 0–35)
AST: 20 U/L (ref 0–37)
Albumin: 3.6 g/dL (ref 3.5–5.2)
Alkaline Phosphatase: 92 U/L (ref 39–117)
Anion gap: 13 (ref 5–15)
BUN: 26 mg/dL — ABNORMAL HIGH (ref 6–23)
CO2: 20 mmol/L (ref 19–32)
Calcium: 9.2 mg/dL (ref 8.4–10.5)
Chloride: 93 mEq/L — ABNORMAL LOW (ref 96–112)
Creatinine, Ser: 1.53 mg/dL — ABNORMAL HIGH (ref 0.50–1.10)
GFR calc Af Amer: 46 mL/min — ABNORMAL LOW (ref >90)
GFR calc non Af Amer: 39 mL/min — ABNORMAL LOW (ref >90)
Glucose, Bld: 662 mg/dL (ref 70–99)
Potassium: 4.8 mmol/L (ref 3.5–5.1)
Sodium: 126 mmol/L — ABNORMAL LOW (ref 135–145)
Total Bilirubin: 1 mg/dL (ref 0.3–1.2)
Total Protein: 7.1 g/dL (ref 6.0–8.3)

## 2014-12-24 LAB — POC URINE PREG, ED
Preg Test, Ur: NEGATIVE
Preg Test, Ur: NEGATIVE

## 2014-12-24 LAB — CBG MONITORING, ED
Glucose-Capillary: >600 mg/dL (ref 70–99)
Glucose-Capillary: 449 mg/dL — ABNORMAL HIGH (ref 70–99)
Glucose-Capillary: 245 mg/dL — ABNORMAL HIGH (ref 70–99)

## 2014-12-24 LAB — URINE MICROSCOPIC-ADD ON

## 2014-12-24 MED ORDER — INSULIN ASPART 100 UNIT/ML ~~LOC~~ SOLN
15.0000 [IU] | Freq: Once | SUBCUTANEOUS | Status: AC
  Administered 2014-12-24: 15 [IU] via INTRAVENOUS
  Filled 2014-12-24: qty 1

## 2014-12-24 MED ORDER — INSULIN LISPRO 100 UNIT/ML ~~LOC~~ SOLN: 6.0000 [IU] | Freq: Three times a day (TID) | SUBCUTANEOUS | Status: AC

## 2014-12-24 MED ORDER — SODIUM CHLORIDE 0.9 % IV BOLUS (SEPSIS)
2000.0000 mL | Freq: Once | INTRAVENOUS | Status: AC
  Administered 2014-12-24: 2000 mL via INTRAVENOUS

## 2014-12-24 MED ORDER — ONDANSETRON HCL 4 MG/2ML IJ SOLN
4.0000 mg | Freq: Once | INTRAMUSCULAR | Status: AC
  Administered 2014-12-24: 4 mg via INTRAVENOUS
  Filled 2014-12-24: qty 2

## 2014-12-24 MED ORDER — INSULIN GLARGINE 100 UNIT/ML ~~LOC~~ SOLN: 26.0000 [IU] | SUBCUTANEOUS | Status: AC

## 2014-12-24 MED ORDER — INSULIN ASPART 100 UNIT/ML ~~LOC~~ SOLN
12.0000 [IU] | Freq: Once | SUBCUTANEOUS | Status: AC
  Administered 2014-12-24: 12 [IU] via INTRAVENOUS
  Filled 2014-12-24: qty 1

## 2014-12-24 NOTE — Discharge Instructions (Signed)

## 2014-12-24 NOTE — ED Notes (Signed)
CRITICAL VALUE ALERT  Critical value received:  Blood glucose 662  Date of notification: 12/24/2013 Time of notification:  1807  Critical value read back: yes  Nurse who received alert:  Harlow AsaHolley Roberson RN  MD notified: Juleen ChinaKohut MD

## 2014-12-24 NOTE — ED Notes (Signed)
Pt c/o increased urinary frequency. States that she has been out of her novolog and levemir for 3-4 days. States that her PCP would not refill her RX.

## 2014-12-24 NOTE — ED Provider Notes (Signed)
CSN: [id number]     Arrival date & time 12/24/14  1554 History   First MD Initiated Contact with Patient 12/24/14 1633     Chief Complaint  Patient presents with  . Hyperglycemia     (Consider location/radiation/quality/duration/timing/severity/associated sxs/prior Treatment) HPI   49 year old female with hyperglycemia. Patient reports being without her diabetic medications past 3-4 days. She reports being seen by her PCP 2 days ago and diagnosed with bacterial vaginosis. She is currently being treated for this but says that PCP would not give her prescriptions for her insulin? Over the past 2 days she has had polyuria polydipsia. Generalized fatigue. Some mild nausea, but no vomiting. She denies any pain anywhere. No acute visual changes. No dizziness or lightheadedness. No urinary complaints aside from polyuria. No fever.  Past Medical History  Diagnosis Date  . Diabetes mellitus     A1C 15.8   12/17/12  . Irregular heart beat   . Depression     Was taking abilify, celexa  . Herpes     Takes acyclovir BID  . Narcotic abuse     Attends NA mtgs  . Hypertension   . Anxiety   . Smoker   . Polysubstance abuse     (+) cocaine 12/15/12   Past Surgical History  Procedure Laterality Date  . Tubal ligation     Family History  Problem Relation Age of Onset  . Heart disease Mother   . Hypertension Mother   . Coronary artery disease Mother    History  Substance Use Topics  . Smoking status: Current Every Day Smoker -- 25 years    Types: Cigarettes    Last Attempt to Quit: 12/04/2010  . Smokeless tobacco: Never Used  . Alcohol Use: No     Comment: drank when she used   OB History    Gravida Para Term Preterm AB TAB SAB Ectopic Multiple Living   4 2 0  2 1 1   2      Review of Systems  All systems reviewed and negative, other than as noted in HPI.   Allergies  Review of patient's allergies indicates no known allergies.  Home Medications   Prior to Admission  medications   Medication Sig Start Date End Date Taking? Authorizing Provider  acyclovir (ZOVIRAX) 400 MG tablet Take 1 tablet (400 mg total) by mouth 2 (two) times daily. 10/29/13  Yes Charm RingsErin J Honig, MD  cyclobenzaprine (FLEXERIL) 10 MG tablet Take 1 tablet (10 mg total) by mouth at bedtime. 12/08/14  Yes Mellody DrownLauren Parker, PA-C  gabapentin (NEURONTIN) 300 MG capsule Take 1 capsule (300 mg total) by mouth 3 (three) times daily. 10/29/13  Yes Charm RingsErin J Honig, MD  insulin glargine (LANTUS) 100 UNIT/ML injection Inject 0.26 mLs (26 Units total) into the skin every morning. 10/29/13  Yes Charm RingsErin J Honig, MD  insulin lispro (HUMALOG) 100 UNIT/ML injection Inject 6 Units into the skin 3 (three) times daily with meals.   Yes Historical Provider, MD  metFORMIN (GLUCOPHAGE-XR) 750 MG 24 hr tablet Take 2 tablets (1,500 mg total) by mouth daily with supper. 10/29/13  Yes Charm RingsErin J Honig, MD  metroNIDAZOLE (FLAGYL) 500 MG tablet Take 1 tablet (500 mg total) by mouth 2 (two) times daily. Do not drink alcohol while taking this medicine. 12/22/14  Yes Alexander Althea CharonKaramalegos, DO  doxycycline (VIBRA-TABS) 100 MG tablet Take 1 tablet (100 mg total) by mouth 2 (two) times daily. Patient not taking: Reported on 12/24/2014 05/14/14   Stephanie Couphristopher M  Street, MD  fluconazole (DIFLUCAN) 150 MG tablet Take 1 tablet (150 mg total) by mouth once. Take 2nd tablet on Day 3 if symptoms persist. (start after antibiotic, only if yeast infection) 12/22/14   Saralyn Pilar, DO  HYDROcodone-acetaminophen (NORCO/VICODIN) 5-325 MG per tablet Take 1 tablet by mouth every 6 (six) hours as needed for moderate pain or severe pain. Patient not taking: Reported on 12/24/2014 12/08/14   Mellody Drown, PA-C   BP 133/87 mmHg  Pulse 87  Temp(Src) 98 F (36.7 C) (Oral)  Resp 17  Ht  (1.803 m)  Wt 213 lb (96.616 kg)  BMI 29.72 kg/m2  SpO2 100%  LMP 11/17/2014 Physical Exam  Constitutional: She appears well-developed and well-nourished. No distress.   HENT:  Head: Normocephalic and atraumatic.  Eyes: Conjunctivae are normal. Right eye exhibits no discharge. Left eye exhibits no discharge.  Neck: Neck supple.  Cardiovascular: Normal rate, regular rhythm and normal heart sounds.  Exam reveals no gallop and no friction rub.   No murmur heard. Pulmonary/Chest: Effort normal and breath sounds normal. No respiratory distress.  Abdominal: Soft. She exhibits no distension. There is no tenderness.  Musculoskeletal: She exhibits no edema or tenderness.  Neurological: She is alert.  Skin: Skin is warm and dry.  Psychiatric: She has a normal mood and affect. Her behavior is normal. Thought content normal.  Nursing note and vitals reviewed.   ED Course  Procedures (including critical care time) Labs Review Labs Reviewed  COMPREHENSIVE METABOLIC PANEL - Abnormal; Notable for the following:    Sodium 126 (*)    Chloride 93 (*)    Glucose, Bld 662 (*)    BUN 26 (*)    Creatinine, Ser 1.53 (*)    GFR calc non Af Amer 39 (*)    GFR calc Af Amer 46 (*)    All other components within normal limits  CBC WITH DIFFERENTIAL - Abnormal; Notable for the following:    RBC 5.64 (*)    Hemoglobin 16.6 (*)    HCT 47.6 (*)    All other components within normal limits  URINALYSIS, ROUTINE W REFLEX MICROSCOPIC - Abnormal; Notable for the following:    Specific Gravity, Urine 1.039 (*)    Glucose, UA >1000 (*)    Hgb urine dipstick TRACE (*)    Ketones, ur 15 (*)    All other components within normal limits  URINE MICROSCOPIC-ADD ON - Abnormal; Notable for the following:    Squamous Epithelial / LPF FEW (*)    All other components within normal limits  CBG MONITORING, ED - Abnormal; Notable for the following:    Glucose-Capillary >600 (*)    All other components within normal limits  CBG MONITORING, ED - Abnormal; Notable for the following:    Glucose-Capillary 449 (*)    All other components within normal limits  CBG MONITORING, ED - Abnormal;  Notable for the following:    Glucose-Capillary 245 (*)    All other components within normal limits  POC URINE PREG, ED  POC URINE PREG, ED    Imaging Review No results found.   EKG Interpretation None      MDM   Final diagnoses:  Hyperglycemia    49 year old female with symptomatic hyperglycemia. She is not in DKA. She generally appears well. Will treat with IV fluids and insulin. I feel she is appropriate for discharge once her blood sugar is in more reasonable range. Will discharge with prescriptions for her diabetic medications.  Raeford Razor, MD 12/24/14 2109

## 2014-12-29 MED ORDER — GABAPENTIN 300 MG PO CAPS: 300.0000 mg | ORAL_CAPSULE | Freq: Three times a day (TID) | ORAL | Status: AC

## 2014-12-29 NOTE — Telephone Encounter (Signed)
Pt had insulin refilled during recent ED visit. I have sent in a refill on her gabapentin. Please inform pt that she is past due for a follow up appt for her diabetes ([facility]).  Pt should schedule appt to discuss diabetes.  Thanks, Latrelle DodrillBrittany J Saylah Ketner, MD

## 2014-12-30 MED ORDER — ACYCLOVIR 400 MG PO TABS: 400.0000 mg | ORAL_TABLET | Freq: Two times a day (BID) | ORAL | Status: AC

## 2014-12-30 NOTE — Telephone Encounter (Signed)
Pt informed. Wanted a refill on acyclovir also. Jamylah Marinaccio Bruna PotterBlount, CMA

## 2015-01-22 ENCOUNTER — Encounter (HOSPITAL_COMMUNITY): Payer: Self-pay | Admitting: *Deleted

## 2015-01-22 ENCOUNTER — Emergency Department (HOSPITAL_COMMUNITY)
Admission: EM | Admit: 2015-01-22 | Discharge: 2015-01-22 | Disposition: A | Discharge status: Home / Self Care | Payer: Medicaid Other | Attending: Emergency Medicine | Admitting: Emergency Medicine

## 2015-01-22 ENCOUNTER — Emergency Department (HOSPITAL_COMMUNITY): Payer: Medicaid Other

## 2015-01-22 DIAGNOSIS — E119 Type 2 diabetes mellitus without complications: Secondary | ICD-10-CM | POA: Diagnosis not present

## 2015-01-22 DIAGNOSIS — Z794 Long term (current) use of insulin: Secondary | ICD-10-CM | POA: Diagnosis not present

## 2015-01-22 DIAGNOSIS — R220 Localized swelling, mass and lump, head: Secondary | ICD-10-CM | POA: Diagnosis not present

## 2015-01-22 DIAGNOSIS — R51 Headache: Secondary | ICD-10-CM | POA: Diagnosis not present

## 2015-01-22 DIAGNOSIS — Z79899 Other long term (current) drug therapy: Secondary | ICD-10-CM | POA: Insufficient documentation

## 2015-01-22 DIAGNOSIS — Z8619 Personal history of other infectious and parasitic diseases: Secondary | ICD-10-CM | POA: Diagnosis not present

## 2015-01-22 DIAGNOSIS — I10 Essential (primary) hypertension: Secondary | ICD-10-CM | POA: Diagnosis not present

## 2015-01-22 DIAGNOSIS — R519 Headache, unspecified: Secondary | ICD-10-CM

## 2015-01-22 DIAGNOSIS — F329 Major depressive disorder, single episode, unspecified: Secondary | ICD-10-CM | POA: Diagnosis not present

## 2015-01-22 DIAGNOSIS — F419 Anxiety disorder, unspecified: Secondary | ICD-10-CM | POA: Insufficient documentation

## 2015-01-22 DIAGNOSIS — J3489 Other specified disorders of nose and nasal sinuses: Secondary | ICD-10-CM | POA: Insufficient documentation

## 2015-01-22 DIAGNOSIS — Z720 Tobacco use: Secondary | ICD-10-CM | POA: Diagnosis not present

## 2015-01-22 MED ORDER — AMOXICILLIN-POT CLAVULANATE 875-125 MG PO TABS: 1.0000 | ORAL_TABLET | Freq: Two times a day (BID) | ORAL | Status: DC

## 2015-01-22 MED ORDER — OXYCODONE-ACETAMINOPHEN 5-325 MG PO TABS
1.0000 | ORAL_TABLET | Freq: Once | ORAL | Status: AC
  Administered 2015-01-22: 1 via ORAL
  Filled 2015-01-22: qty 1

## 2015-01-22 MED ORDER — HYDROCODONE-ACETAMINOPHEN 5-325 MG PO TABS: 1.0000 | ORAL_TABLET | Freq: Four times a day (QID) | ORAL | Status: AC | PRN

## 2015-01-22 NOTE — ED Notes (Signed)
Declined W/C at D/C and was escorted to lobby by RN. 

## 2015-01-22 NOTE — ED Provider Notes (Signed)
CSN: [id number]     Arrival date & time 01/22/15  2440 History  This chart was scribed for Jaynie Crumble, PA-C, working with Derwood Kaplan, MD by Chestine Spore, ED Scribe. The patient was seen in room TR08C/TR08C at 9:05 AM.    Chief Complaint  Patient presents with  . Facial Pain     The history is provided by the patient. No language interpreter was used.    HPI Comments: Patricia Charles is a 49 y.o. female with a medical hx of DM who presents to the Emergency Department complaining of worsening left facial pain onset 2 weeks. She states that she thinks that she has a sinus infection. She reports that her nose feels swollen. She states that she is having associated symptoms of facial swelling. She states that she has tried steam, aleve, tylenol. Alka seltzer plus with no relief for her symptoms. She notes that the steam aids in mild relief. She denies rhinorrhea, fever, ear pain, and any other symptoms. She notes that her sugars run 127.    Past Medical History  Diagnosis Date  . Diabetes mellitus     A1C 15.8   12/17/12  . Irregular heart beat   . Depression     Was taking abilify, celexa  . Herpes     Takes acyclovir BID  . Narcotic abuse     Attends NA mtgs  . Hypertension   . Anxiety   . Smoker   . Polysubstance abuse     (+) cocaine 12/15/12   Past Surgical History  Procedure Laterality Date  . Tubal ligation     Family History  Problem Relation Age of Onset  . Heart disease Mother   . Hypertension Mother   . Coronary artery disease Mother    History  Substance Use Topics  . Smoking status: Current Every Day Smoker -- 25 years    Types: Cigarettes    Last Attempt to Quit: 12/04/2010  . Smokeless tobacco: Never Used  . Alcohol Use: No     Comment: drank when she used   OB History    Gravida Para Term Preterm AB TAB SAB Ectopic Multiple Living   4 2 0  Review of Systems  Constitutional: Negative for fever.  HENT: Positive for facial  swelling. Negative for ear pain and rhinorrhea.   All other systems reviewed and are negative.     Allergies  Review of patient's allergies indicates no known allergies.  Home Medications   Prior to Admission medications   Medication Sig Start Date End Date Taking? Authorizing Provider  acyclovir (ZOVIRAX) 400 MG tablet Take 1 tablet (400 mg total) by mouth 2 (two) times daily. 12/30/14   Latrelle Dodrill, MD  cyclobenzaprine (FLEXERIL) 10 MG tablet Take 1 tablet (10 mg total) by mouth at bedtime. 12/08/14   Mellody Drown, PA-C  doxycycline (VIBRA-TABS) 100 MG tablet Take 1 tablet (100 mg total) by mouth 2 (two) times daily. Patient not taking: Reported on 12/24/2014 05/14/14   Stephanie Coup Street, MD  fluconazole (DIFLUCAN) 150 MG tablet Take 1 tablet (150 mg total) by mouth once. Take 2nd tablet on Day 3 if symptoms persist. (start after antibiotic, only if yeast infection) 12/22/14   Saralyn Pilar, DO  gabapentin (NEURONTIN) 300 MG capsule Take 1 capsule (300 mg total) by mouth 3 (three) times daily. 12/29/14   Latrelle Dodrill, MD  HYDROcodone-acetaminophen (NORCO/VICODIN) 5-325 MG per tablet Take  1 tablet by mouth every 6 (six) hours as needed for moderate pain or severe pain. Patient not taking: Reported on 12/24/2014 12/08/14   Mellody DrownLauren Parker, PA-C  insulin glargine (LANTUS) 100 UNIT/ML injection Inject 0.26 mLs (26 Units total) into the skin every morning. 12/24/14   Raeford RazorStephen Kohut, MD  insulin lispro (HUMALOG) 100 UNIT/ML injection Inject 0.06 mLs (6 Units total) into the skin 3 (three) times daily with meals. 12/24/14   Raeford RazorStephen Kohut, MD  metFORMIN (GLUCOPHAGE-XR) 750 MG 24 hr tablet Take 2 tablets (1,500 mg total) by mouth daily with supper. 10/29/13   Charm RingsErin J Honig, MD  metroNIDAZOLE (FLAGYL) 500 MG tablet Take 1 tablet (500 mg total) by mouth 2 (two) times daily. Do not drink alcohol while taking this medicine. 12/22/14   Alexander Karamalegos, DO   BP 167/116 mmHg  Pulse 16   Temp(Src) 98 F (36.7 C) (Oral)  Resp 16  Ht 5\' 11"  (1.803 m)  Wt 186 lb (84.369 kg)  BMI 25.95 kg/m2  SpO2 99%  Physical Exam  Constitutional: She is oriented to person, place, and time. She appears well-developed and well-nourished. No distress.  HENT:  Head: Normocephalic and atraumatic.  Right Ear: Tympanic membrane, external ear and ear canal normal.  Left Ear: Tympanic membrane, external ear and ear canal normal.  Nose: Mucosal edema present. Right sinus exhibits no maxillary sinus tenderness and no frontal sinus tenderness. Left sinus exhibits maxillary sinus tenderness. Left sinus exhibits no frontal sinus tenderness.  Mouth/Throat: Oropharynx is clear and moist.  Left perinasal and maxillary tenderness. Teeth appear to be normal  Eyes: EOM are normal.  Neck: Neck supple. No tracheal deviation present.  Cardiovascular: Normal rate.   Pulmonary/Chest: Effort normal. No respiratory distress.  Musculoskeletal: Normal range of motion.  Neurological: She is alert and oriented to person, place, and time.  Skin: Skin is warm and dry.  Psychiatric: She has a normal mood and affect. Her behavior is normal.  Nursing note and vitals reviewed.   ED Course  Procedures (including critical care time) DIAGNOSTIC STUDIES: Oxygen Saturation is 99% on room air, normal by my interpretation.    COORDINATION OF CARE: 9:09 AM-Discussed treatment plan which includes CT of maxillofacial without contrast, Percocet with pt at bedside and pt agreed to plan.   Labs Review Labs Reviewed - No data to display  Imaging Review Ct Maxillofacial Wo Cm  01/22/2015   CLINICAL DATA:  Facial pain, sinus pressure, swelling left face  EXAM: CT MAXILLOFACIAL WITHOUT CONTRAST  TECHNIQUE: Multidetector CT imaging of the maxillofacial structures was performed. Multiplanar CT image reconstructions were also generated. A small metallic BB was placed on the right temple in order to reliably differentiate right from  left.  COMPARISON:  None.  FINDINGS: Axial images shows no nasal bone fracture. No paranasal sinuses mucosal thickening or air-fluid levels. The mastoid air cells are unremarkable. Nasal septum is midline.  No intraorbital hematoma. Bilateral eye globes are symmetrical in appearance. No facial fluid collection.  Coronal images shows minimal left deviation of mid nasal bony septum. There is mild nasal mucosal thickening left inferior turbinate. Bilateral semilunar canal is patent. Minimal mucosal thickening of left middle turbinate. Mild narrowing of mid and inferior left nasal airway.  The mandible is unremarkable.  No TMJ dislocation.  Sagittal images shows patent nasopharyngeal and oropharyngeal airway. The visualized upper cervical spine is unremarkable.  IMPRESSION: 1. No paranasal sinuses mucosal thickening or air-fluid levels. 2. Minimal left deviation of mid aspect of nasal  bony septum. 3. No facial fractures of facial fluid collection. 4. No intraorbital hematoma. 5. There is nasal mucosal thickening left inferior turbinate. Minimal mucosal thickening left middle turbinate. Mild crowding of left nasal airway. Bilateral semilunar canal is patent.   Electronically Signed   By: Natasha Mead M.D.   On: 01/22/2015 10:00     EKG Interpretation None      MDM   Final diagnoses:  Facial pain, acute    patient with left maxillary and paranasal tenderness. She appears to be in a lot of pain. CT maxillofacial obtained, no sinus disease, however we did CT without contrast, no obvious abscesses seen. There was some swelling in the left nasal passage. I am concerned about possible early infection and possible abscess in this diabetic female. Will start her on Augmentin. She is afebrile here, nontoxic appearing. She has an appointment with a dentist in 3 days. Also instructed to follow-up with her primary care doctor specially if pain not improving. The Norco for pain at home.    Filed Vitals:   01/22/15 0850  01/22/15 0921 01/22/15 1022  BP: 167/116 157/101 154/95  Pulse: 79 81 81  Temp: 98 F (36.7 C)  98 F (36.7 C)  TempSrc: Oral  Oral  Resp: Height:  (1.803 m)    Weight: 186 lb (84.369 kg)    SpO2: 99% 99% 100%    I personally performed the services described in this documentation, which was scribed in my presence. The recorded information has been reviewed and is accurate.    Lottie Mussel, PA-C 01/22/15 1046  Derwood Kaplan, MD 01/23/15 [phone]

## 2015-01-22 NOTE — ED Notes (Signed)
To ED for eval of facial pain for the past week. Pain getting worse. Feel like sinus pressure. Pt taking otc tylenol (last dose at 0600). Aleve, and cold meds. Pt states the only relief is from putting face over boiling water pot. Min to mod swelling noted to left face/sinus area. No sinus drainage at this time. States only drainage is when putting face over pot of water

## 2015-01-22 NOTE — Discharge Instructions (Signed)
Take antibiotics as prescribed until gone. Nasal saline every few hours. Pain medications as prescribed as needed. Follow up with a dentist and primary care doctor

## 2015-02-09 ENCOUNTER — Ambulatory Visit (INDEPENDENT_AMBULATORY_CARE_PROVIDER_SITE_OTHER): Payer: Medicaid Other | Admitting: *Deleted

## 2015-02-09 DIAGNOSIS — Z111 Encounter for screening for respiratory tuberculosis: Secondary | ICD-10-CM

## 2015-02-09 NOTE — Progress Notes (Signed)
   PPD placed Left Forearm.  Pt to return 02/11/2015 for reading.  Pt tolerated intradermal injection. Clovis PuMartin, Alamin Mccuiston L, RN

## 2015-02-20 ENCOUNTER — Inpatient Hospital Stay (HOSPITAL_COMMUNITY): Payer: Medicaid Other

## 2015-02-20 ENCOUNTER — Other Ambulatory Visit (HOSPITAL_COMMUNITY): Payer: Self-pay

## 2015-02-20 ENCOUNTER — Encounter (HOSPITAL_COMMUNITY): Payer: Self-pay

## 2015-02-20 ENCOUNTER — Inpatient Hospital Stay (HOSPITAL_COMMUNITY)
Admission: EM | Admit: 2015-02-20 | Discharge: 2015-02-22 | DRG: 638 | Disposition: A | Discharge status: Home / Self Care | Payer: Medicaid Other | Attending: Family Medicine | Admitting: Family Medicine

## 2015-02-20 DIAGNOSIS — Z79899 Other long term (current) drug therapy: Secondary | ICD-10-CM | POA: Diagnosis not present

## 2015-02-20 DIAGNOSIS — E1110 Type 2 diabetes mellitus with ketoacidosis without coma: Secondary | ICD-10-CM | POA: Diagnosis present

## 2015-02-20 DIAGNOSIS — F419 Anxiety disorder, unspecified: Secondary | ICD-10-CM | POA: Diagnosis present

## 2015-02-20 DIAGNOSIS — B009 Herpesviral infection, unspecified: Secondary | ICD-10-CM | POA: Diagnosis present

## 2015-02-20 DIAGNOSIS — R112 Nausea with vomiting, unspecified: Secondary | ICD-10-CM | POA: Insufficient documentation

## 2015-02-20 DIAGNOSIS — R000 Tachycardia, unspecified: Secondary | ICD-10-CM | POA: Diagnosis present

## 2015-02-20 DIAGNOSIS — E1310 Other specified diabetes mellitus with ketoacidosis without coma: Secondary | ICD-10-CM | POA: Diagnosis not present

## 2015-02-20 DIAGNOSIS — F17210 Nicotine dependence, cigarettes, uncomplicated: Secondary | ICD-10-CM | POA: Diagnosis present

## 2015-02-20 DIAGNOSIS — F329 Major depressive disorder, single episode, unspecified: Secondary | ICD-10-CM | POA: Diagnosis present

## 2015-02-20 DIAGNOSIS — R739 Hyperglycemia, unspecified: Secondary | ICD-10-CM | POA: Diagnosis not present

## 2015-02-20 DIAGNOSIS — E785 Hyperlipidemia, unspecified: Secondary | ICD-10-CM | POA: Diagnosis present

## 2015-02-20 DIAGNOSIS — Z794 Long term (current) use of insulin: Secondary | ICD-10-CM | POA: Diagnosis not present

## 2015-02-20 DIAGNOSIS — N179 Acute kidney failure, unspecified: Secondary | ICD-10-CM | POA: Diagnosis not present

## 2015-02-20 DIAGNOSIS — R7989 Other specified abnormal findings of blood chemistry: Secondary | ICD-10-CM | POA: Insufficient documentation

## 2015-02-20 DIAGNOSIS — E878 Other disorders of electrolyte and fluid balance, not elsewhere classified: Secondary | ICD-10-CM | POA: Diagnosis present

## 2015-02-20 DIAGNOSIS — E0810 Diabetes mellitus due to underlying condition with ketoacidosis without coma: Secondary | ICD-10-CM

## 2015-02-20 DIAGNOSIS — A084 Viral intestinal infection, unspecified: Secondary | ICD-10-CM | POA: Insufficient documentation

## 2015-02-20 DIAGNOSIS — E1142 Type 2 diabetes mellitus with diabetic polyneuropathy: Secondary | ICD-10-CM | POA: Diagnosis present

## 2015-02-20 DIAGNOSIS — I10 Essential (primary) hypertension: Secondary | ICD-10-CM | POA: Diagnosis present

## 2015-02-20 LAB — CBG MONITORING, ED
GLUCOSE-CAPILLARY: 316 mg/dL — AB (ref 70–99)
GLUCOSE-CAPILLARY: 236 mg/dL — AB (ref 70–99)
GLUCOSE-CAPILLARY: 217 mg/dL — AB (ref 70–99)
Glucose-Capillary: 370 mg/dL — ABNORMAL HIGH (ref 70–99)
Glucose-Capillary: 332 mg/dL — ABNORMAL HIGH (ref 70–99)
Glucose-Capillary: 186 mg/dL — ABNORMAL HIGH (ref 70–99)
Glucose-Capillary: 223 mg/dL — ABNORMAL HIGH (ref 70–99)

## 2015-02-20 LAB — GLUCOSE, CAPILLARY
GLUCOSE-CAPILLARY: 178 mg/dL — AB (ref 70–99)
GLUCOSE-CAPILLARY: 151 mg/dL — AB (ref 70–99)
Glucose-Capillary: 132 mg/dL — ABNORMAL HIGH (ref 70–99)
Glucose-Capillary: 164 mg/dL — ABNORMAL HIGH (ref 70–99)

## 2015-02-20 LAB — CBC WITH DIFFERENTIAL/PLATELET
BASOS PCT: 0 % (ref 0–1)
Basophils Absolute: 0 10*3/uL (ref 0.0–0.1)
Eosinophils Absolute: 0 10*3/uL (ref 0.0–0.7)
Eosinophils Relative: 0 % (ref 0–5)
HCT: 47.2 % — ABNORMAL HIGH (ref 36.0–46.0)
Hemoglobin: 16.1 g/dL — ABNORMAL HIGH (ref 12.0–15.0)
LYMPHS ABS: 2.2 10*3/uL (ref 0.7–4.0)
Lymphocytes Relative: 23 % (ref 12–46)
MCH: 28.8 pg (ref 26.0–34.0)
MCHC: 34.1 g/dL (ref 30.0–36.0)
MCV: 84.3 fL (ref 78.0–100.0)
Monocytes Absolute: 0.4 10*3/uL (ref 0.1–1.0)
Monocytes Relative: 4 % (ref 3–12)
NEUTROS ABS: 6.7 10*3/uL (ref 1.7–7.7)
Neutrophils Relative %: 73 % (ref 43–77)
Platelets: 243 10*3/uL (ref 150–400)
RBC: 5.6 MIL/uL — ABNORMAL HIGH (ref 3.87–5.11)
RDW: 14.9 % (ref 11.5–15.5)
WBC: 9.3 10*3/uL (ref 4.0–10.5)

## 2015-02-20 LAB — URINALYSIS, ROUTINE W REFLEX MICROSCOPIC
BILIRUBIN URINE: NEGATIVE
Glucose, UA: >1000 mg/dL — AB
Ketones, ur: >80 mg/dL — AB
LEUKOCYTES UA: NEGATIVE
NITRITE: NEGATIVE
Protein, ur: 100 mg/dL — AB
Specific Gravity, Urine: 1.036 — ABNORMAL HIGH (ref 1.005–1.030)
Urobilinogen, UA: 0.2 mg/dL (ref 0.0–1.0)
pH: 5 (ref 5.0–8.0)

## 2015-02-20 LAB — CBC
HCT: 45 % (ref 36.0–46.0)
HCT: 46.4 % — ABNORMAL HIGH (ref 36.0–46.0)
Hemoglobin: 15.3 g/dL — ABNORMAL HIGH (ref 12.0–15.0)
Hemoglobin: 15.7 g/dL — ABNORMAL HIGH (ref 12.0–15.0)
MCH: 28.8 pg (ref 26.0–34.0)
MCH: 28.4 pg (ref 26.0–34.0)
MCHC: 34 g/dL (ref 30.0–36.0)
MCHC: 33.8 g/dL (ref 30.0–36.0)
MCV: 84.6 fL (ref 78.0–100.0)
MCV: 84.1 fL (ref 78.0–100.0)
Platelets: 254 10*3/uL (ref 150–400)
Platelets: 252 10*3/uL (ref 150–400)
RBC: 5.32 MIL/uL — AB (ref 3.87–5.11)
RBC: 5.52 MIL/uL — ABNORMAL HIGH (ref 3.87–5.11)
RDW: 15.1 % (ref 11.5–15.5)
RDW: 14.9 % (ref 11.5–15.5)
WBC: 8.7 10*3/uL (ref 4.0–10.5)
WBC: 8.5 10*3/uL (ref 4.0–10.5)

## 2015-02-20 LAB — LIPID PANEL
CHOL/HDL RATIO: 4.5 ratio
CHOLESTEROL: 286 mg/dL — AB (ref 0–200)
HDL: 63 mg/dL (ref >39)
LDL Cholesterol: 201 mg/dL — ABNORMAL HIGH (ref 0–99)
Triglycerides: 108 mg/dL (ref <150)
VLDL: 22 mg/dL (ref 0–40)

## 2015-02-20 LAB — COMPREHENSIVE METABOLIC PANEL
ALBUMIN: 3.7 g/dL (ref 3.5–5.2)
ALT: 15 U/L (ref 0–35)
AST: 15 U/L (ref 0–37)
Alkaline Phosphatase: 99 U/L (ref 39–117)
Anion gap: 26 — ABNORMAL HIGH (ref 5–15)
BILIRUBIN TOTAL: 1.3 mg/dL — AB (ref 0.3–1.2)
BUN: 16 mg/dL (ref 6–23)
CHLORIDE: 89 mmol/L — AB (ref 96–112)
CO2: 18 mmol/L — AB (ref 19–32)
Calcium: 9.8 mg/dL (ref 8.4–10.5)
Creatinine, Ser: 1.52 mg/dL — ABNORMAL HIGH (ref 0.50–1.10)
GFR calc non Af Amer: 40 mL/min — ABNORMAL LOW (ref >90)
GFR, EST AFRICAN AMERICAN: 46 mL/min — AB (ref >90)
Glucose, Bld: 417 mg/dL — ABNORMAL HIGH (ref 70–99)
Potassium: 5.1 mmol/L (ref 3.5–5.1)
SODIUM: 133 mmol/L — AB (ref 135–145)
Total Protein: 7.8 g/dL (ref 6.0–8.3)

## 2015-02-20 LAB — I-STAT TROPONIN, ED: Troponin i, poc: 0 ng/mL (ref 0.00–0.08)

## 2015-02-20 LAB — BASIC METABOLIC PANEL
ANION GAP: 12 (ref 5–15)
ANION GAP: 4 — AB (ref 5–15)
Anion gap: 19 — ABNORMAL HIGH (ref 5–15)
Anion gap: 8 (ref 5–15)
BUN: 14 mg/dL (ref 6–23)
BUN: 13 mg/dL (ref 6–23)
BUN: 12 mg/dL (ref 6–23)
BUN: 11 mg/dL (ref 6–23)
CALCIUM: 8.4 mg/dL (ref 8.4–10.5)
CALCIUM: 8.8 mg/dL (ref 8.4–10.5)
CHLORIDE: 101 mmol/L (ref 96–112)
CHLORIDE: 104 mmol/L (ref 96–112)
CO2: 19 mmol/L (ref 19–32)
CO2: 25 mmol/L (ref 19–32)
CO2: 26 mmol/L (ref 19–32)
CO2: 29 mmol/L (ref 19–32)
Calcium: 9.3 mg/dL (ref 8.4–10.5)
Calcium: 9 mg/dL (ref 8.4–10.5)
Chloride: 103 mmol/L (ref 96–112)
Chloride: 105 mmol/L (ref 96–112)
Creatinine, Ser: 1.24 mg/dL — ABNORMAL HIGH (ref 0.50–1.10)
Creatinine, Ser: 1.17 mg/dL — ABNORMAL HIGH (ref 0.50–1.10)
Creatinine, Ser: 0.93 mg/dL (ref 0.50–1.10)
Creatinine, Ser: 1.08 mg/dL (ref 0.50–1.10)
GFR calc Af Amer: 59 mL/min — ABNORMAL LOW (ref >90)
GFR calc Af Amer: 63 mL/min — ABNORMAL LOW (ref >90)
GFR calc Af Amer: 84 mL/min — ABNORMAL LOW (ref >90)
GFR calc non Af Amer: 51 mL/min — ABNORMAL LOW (ref >90)
GFR calc non Af Amer: 55 mL/min — ABNORMAL LOW (ref >90)
GFR calc non Af Amer: 72 mL/min — ABNORMAL LOW (ref >90)
GFR calc non Af Amer: 60 mL/min — ABNORMAL LOW (ref >90)
GFR, EST AFRICAN AMERICAN: 70 mL/min — AB (ref >90)
GLUCOSE: 241 mg/dL — AB (ref 70–99)
GLUCOSE: 224 mg/dL — AB (ref 70–99)
GLUCOSE: 157 mg/dL — AB (ref 70–99)
Glucose, Bld: 144 mg/dL — ABNORMAL HIGH (ref 70–99)
Potassium: 3.6 mmol/L (ref 3.5–5.1)
Potassium: 4.1 mmol/L (ref 3.5–5.1)
Potassium: 3.9 mmol/L (ref 3.5–5.1)
Potassium: 3.4 mmol/L — ABNORMAL LOW (ref 3.5–5.1)
SODIUM: 140 mmol/L (ref 135–145)
Sodium: 139 mmol/L (ref 135–145)
Sodium: 139 mmol/L (ref 135–145)
Sodium: 137 mmol/L (ref 135–145)

## 2015-02-20 LAB — URINE MICROSCOPIC-ADD ON

## 2015-02-20 LAB — BETA-HYDROXYBUTYRIC ACID: BETA-HYDROXYBUTYRIC ACID: 6.71 mmol/L — AB (ref 0.05–0.27)

## 2015-02-20 LAB — TSH: TSH: 0.304 u[IU]/mL — ABNORMAL LOW (ref 0.350–4.500)

## 2015-02-20 LAB — RAPID URINE DRUG SCREEN, HOSP PERFORMED
Amphetamines: NOT DETECTED
BARBITURATES: NOT DETECTED
Benzodiazepines: NOT DETECTED
COCAINE: POSITIVE — AB
Opiates: NOT DETECTED
Tetrahydrocannabinol: NOT DETECTED

## 2015-02-20 LAB — PHOSPHORUS: Phosphorus: 3.2 mg/dL (ref 2.3–4.6)

## 2015-02-20 LAB — MAGNESIUM: Magnesium: 1.8 mg/dL (ref 1.5–2.5)

## 2015-02-20 LAB — TROPONIN I
Troponin I: <0.03 ng/mL (ref <0.031)
Troponin I: <0.03 ng/mL (ref <0.031)

## 2015-02-20 LAB — LIPASE, BLOOD: LIPASE: 19 U/L (ref 11–59)

## 2015-02-20 LAB — MRSA PCR SCREENING: MRSA by PCR: NEGATIVE

## 2015-02-20 MED ORDER — DEXTROSE-NACL 5-0.45 % IV SOLN
INTRAVENOUS | Status: DC
  Administered 2015-02-20: 125 mL/h via INTRAVENOUS
  Administered 2015-02-21: 05:00:00 via INTRAVENOUS

## 2015-02-20 MED ORDER — SODIUM CHLORIDE 0.9 % IV SOLN
INTRAVENOUS | Status: DC
  Administered 2015-02-20: 12:00:00 via INTRAVENOUS

## 2015-02-20 MED ORDER — INSULIN REGULAR BOLUS VIA INFUSION
0.0000 [IU] | Freq: Three times a day (TID) | INTRAVENOUS | Status: DC
  Filled 2015-02-20: qty 10

## 2015-02-20 MED ORDER — PANTOPRAZOLE SODIUM 40 MG IV SOLR
40.0000 mg | INTRAVENOUS | Status: DC
  Administered 2015-02-20: 40 mg via INTRAVENOUS
  Filled 2015-02-20 (×2): qty 40

## 2015-02-20 MED ORDER — SODIUM CHLORIDE 0.9 % IV SOLN
INTRAVENOUS | Status: DC
  Administered 2015-02-20: 2.5 [IU]/h via INTRAVENOUS
  Filled 2015-02-20: qty 2.5

## 2015-02-20 MED ORDER — ONDANSETRON HCL 4 MG/2ML IJ SOLN
4.0000 mg | Freq: Once | INTRAMUSCULAR | Status: AC
  Administered 2015-02-20: 4 mg via INTRAVENOUS
  Filled 2015-02-20: qty 2

## 2015-02-20 MED ORDER — DEXTROSE-NACL 5-0.45 % IV SOLN
INTRAVENOUS | Status: DC
  Administered 2015-02-20: 08:00:00 via INTRAVENOUS

## 2015-02-20 MED ORDER — SODIUM CHLORIDE 0.9 % IV SOLN
INTRAVENOUS | Status: DC
  Administered 2015-02-20: 2.7 [IU]/h via INTRAVENOUS
  Filled 2015-02-20: qty 2.5

## 2015-02-20 MED ORDER — SODIUM CHLORIDE 0.9 % IV SOLN
INTRAVENOUS | Status: DC
  Administered 2015-02-20: 75 mL/h via INTRAVENOUS
  Administered 2015-02-20: 11:00:00 via INTRAVENOUS

## 2015-02-20 MED ORDER — POTASSIUM CHLORIDE 10 MEQ/100ML IV SOLN
10.0000 meq | INTRAVENOUS | Status: AC
  Administered 2015-02-20 (×2): 10 meq via INTRAVENOUS
  Filled 2015-02-20 (×2): qty 100

## 2015-02-20 MED ORDER — PROMETHAZINE HCL 25 MG/ML IJ SOLN
12.5000 mg | Freq: Once | INTRAMUSCULAR | Status: AC
  Administered 2015-02-20: 12.5 mg via INTRAVENOUS
  Filled 2015-02-20: qty 1

## 2015-02-20 MED ORDER — SODIUM CHLORIDE 0.9 % IV BOLUS (SEPSIS)
1000.0000 mL | Freq: Once | INTRAVENOUS | Status: AC
  Administered 2015-02-20: 1000 mL via INTRAVENOUS

## 2015-02-20 MED ORDER — DEXTROSE 50 % IV SOLN: 25.0000 mL | INTRAVENOUS | Status: DC | PRN

## 2015-02-20 MED ORDER — INSULIN ASPART 100 UNIT/ML ~~LOC~~ SOLN
10.0000 [IU] | Freq: Once | SUBCUTANEOUS | Status: AC
  Administered 2015-02-20: 10 [IU] via INTRAVENOUS
  Filled 2015-02-20: qty 1

## 2015-02-20 MED ORDER — PROMETHAZINE HCL 25 MG/ML IJ SOLN
12.5000 mg | Freq: Four times a day (QID) | INTRAMUSCULAR | Status: DC | PRN
  Filled 2015-02-20: qty 1

## 2015-02-20 MED ORDER — GABAPENTIN 300 MG PO CAPS
300.0000 mg | ORAL_CAPSULE | Freq: Three times a day (TID) | ORAL | Status: DC
  Administered 2015-02-21 – 2015-02-22 (×5): 300 mg via ORAL
  Filled 2015-02-20 (×8): qty 1

## 2015-02-20 MED ORDER — HEPARIN SODIUM (PORCINE) 5000 UNIT/ML IJ SOLN
5000.0000 [IU] | Freq: Three times a day (TID) | INTRAMUSCULAR | Status: DC
  Administered 2015-02-20 – 2015-02-22 (×7): 5000 [IU] via SUBCUTANEOUS
  Filled 2015-02-20 (×9): qty 1

## 2015-02-20 MED ORDER — POTASSIUM CHLORIDE 10 MEQ/100ML IV SOLN
10.0000 meq | INTRAVENOUS | Status: AC
  Administered 2015-02-20 – 2015-02-21 (×3): 10 meq via INTRAVENOUS
  Filled 2015-02-20 (×3): qty 100

## 2015-02-20 MED ORDER — SODIUM CHLORIDE 0.9 % IV SOLN
INTRAVENOUS | Status: AC
  Administered 2015-02-20: 12:00:00 via INTRAVENOUS

## 2015-02-20 MED ORDER — POTASSIUM CHLORIDE 10 MEQ/100ML IV SOLN
10.0000 meq | Freq: Once | INTRAVENOUS | Status: AC
  Administered 2015-02-20: 10 meq via INTRAVENOUS
  Filled 2015-02-20: qty 100

## 2015-02-20 NOTE — ED Notes (Signed)
Blood sugar 370

## 2015-02-20 NOTE — ED Notes (Signed)
Dr. Wilkie AyeHorton stated that she did not need an EKG performed at this time.

## 2015-02-20 NOTE — ED Notes (Signed)
IV attempted x 1 by this RN. Attempted multiple times earlier by several other RN's for second line. IV team consulted per charge RN.

## 2015-02-20 NOTE — H&P (Signed)
Family Medicine Teaching [facility]ervice Hospital Admission History and Physical Service Pager: [phone]  Patient name: Patricia LeitzShountina Charles Medical record number: [id number] Date of birth: 07/30/1966 Age: 49 y.o. Gender: female  Primary Care Provider: Levert FeinsteinMcIntyre, Brittany, MD Consultants: None Code Status: Full  Chief Complaint: Nausea and Vomiting   Assessment and Plan: Patricia Charles is a 49 y.o. female presenting with Nausea / Vomiting for 2 days and found to be in Mild DKA. PMH is significant for Poorly Controlled DMII with peripheral neuropathy, HTN, Irregular Heart Beat, Herpes Infection  # Mild Diabetic Ketoacidosis -  Pt. Here with likely viral gastritis induced vomiting for two days tipping off mild DKA. Never been in DKA before. Pt. Poorly controlled DMII by last A1C of 9. Home regimen of 26 u of lantus daily, 6U TID QC Novolog, Metformin 1500 qhs. Bicarb - 18 here, Glucose 449 on admission, GAP of 26 on admission, though widened by hypochloremia (likely due to vomiting).  WBC normal, Afebrile, No SOB, No other prodromal viral illness leading up to vomiting, no aches / chills at home. Istat troponin negative. EKG unchanged from baseline, but irregular heartbeat noted. Corrected Na - 140, K - 5.1. Lipase 19. Hgb elevated (likely hemoconcentrated). Given 2 L NS in the ED. Started in insulin drip. Vital signs stable.  - Admitted to step down under Dr. Randolm IdolFletke - Continuous cardiac monitoring.  - Phenergan for nauea (slightly prolonged Qtc on EKG, though difficult to calculate with wandering atrial rhythm) - S/P bolus x 2. Will give one more and then NS 1.5 times maintenance for 12 hours.  - Insulin drip until CBG's < 250 then transition off of drip.  - Phos, Mg, Beta hydroxybutyric acid, rapid flu.  - BMET q4 hrs until glucose stabilized.  - Hgb A1C - NPO - Will monitor K. 5.1 at initial check.  - CBG q hr.  - Troponin x 3 given irregular heart beat.  - UDS  # Nausea / Vomiting - Pt. With no  recollection of viral prodrome. Afebrile at home, does complain of aches intermittently. Pain predominantly abdominal. Denies eating questionable food. Denies sick contacts. No one else has gotten sick that she knows of. She does not spend time with small children. No other risk factors at this time. Vomitus with food transitioned to yellow / green and then dry heaving. Vomiting approximately 4 times / day. WBC normal, afebrile here, giving fluids as above. Lipase normal, Hypochloremic with slightly low bicarb as above. Vital signs stable.  - Likely viral gastroenteritis as above.  - Monitor response to IVF resuscitation.  - Na corrects for glucose to 140.  - Repeat BMETs for Chloride.  - UDS as above.  - Phenergan for nasea 12.5 - 25 q 6hrs.    # HTN - Historical. Not on medications at home.  - Monitor VS here.   # DMII - Pt. With uncontrolled DMII, last A1C around 9. Pt. Reports CBG's in the 120's / 130's at home. She is compliant with her insulin per her. Lantus is 26u daily, Novolog 6 uTID with meals, and Metforming 1500 qhs.  - Repeat A1C here.  - Transition to SSI once off of insulin drip.   - Gabapentin for peripheral neuropathy.   # Irregular Heartbeat - EKG with evidence of R atrial enlargement, Right anterior fascicular block. Slightly tachycardic (in the setting of vomiting / acute illness). EKG similar to previous one year ago. Never been worked up given evidence of cardiac changes in the setting of  DMII and concern for RA enlargement. No exam findings other than tachycardia.  - Repeat EKG in the AM.  - Troponins x 3 - Consider Echo this admission.  - Will get lipids for risk stratification given DMII, age, and concern for cardiac abnormalities.  - TSH  # Kidney Injury - Pt. With prior history of normal Cr and GFR. Since 1/ 2016 Cr is up to 1.5 and GFR down to the 40's. With hx of DM and past hx of HTN. Could be new baseline with CKD III.  - Will give fluids as abofe.  - trend  BMET  - Note on D/C summary for close follow up as an outpatient.   FEN/GI: NPO, IVF as above.  Prophylaxis: heparin   Disposition: Admit for glucose control and rehydration.   History of Present Illness: Patricia Charles is a 49 y.o. female presenting with  nausea and vomiting for the last 2 days with hyperglycemia. She says that she began having nausea and vomiting the night of 3/3. She says she does not remember eating any questionable food, known ulcer has been sick, she has not had any congestion, she has not been around any small children. Not had diarrhea. Her vomitus appears to be yellow or green with food initially but transitioning to only liquid. Denies fever, chills. She does have some aches of her lower extremity is in abdomen. She endorses abdominal pain diffusely. Her pain is achy rather than sharp. It is worse with retching. She says that she has been taking her CBGs at home have been between 120 and 1:30 with highest being 160. He has been taking her insulin as well. She endorses compliance with all of her medications. Denies chest pain, shortness of breath. She denies vision changes, headaches. She is alert and oriented.  In the ED, she is on have hyperglycemia to the 400s. Vital signs stable, she has been slightly tachycardic. She is found to have a metabolic acidosis with a slightly elevated anion gap, though this is in large and by hypochloremia secondary to vomiting. Started on insulin drip in the ED with plan to admit to stepdown unit for insulin drip with transition to her home insulin 1 CBGs have stabilized.  Review Of Systems: Per HPI with the following additions: None Otherwise 12 point review of systems was performed and was unremarkable.  Patient Active Problem List   Diagnosis Date Noted  . Hyperglycemia 02/20/2015  . Diabetic ketoacidosis associated with type 2 diabetes mellitus 02/20/2015  . Vaginal itching 12/22/2014  . Trichomonal vaginitis 12/22/2014  . Tobacco  abuse 10/29/2013  . Mixed, or nondependent drug abuse 12/21/2012  . Fungal toenail infection 02/01/2012  . Diabetic neuropathy, type II diabetes mellitus 01/31/2012  . Well woman exam 08/10/2011  . Hyperlipidemia 08/10/2011  . Hypertension 02/03/2011  . DM (diabetes mellitus), type 2, uncontrolled 02/01/2011  . Irregular heart beat 02/01/2011  . Herpes 02/01/2011   Past Medical History: Past Medical History  Diagnosis Date  . Diabetes mellitus     A1C 15.8   12/17/12  . Irregular heart beat   . Depression     Was taking abilify, celexa  . Herpes     Takes acyclovir BID  . Narcotic abuse     Attends NA mtgs  . Hypertension   . Anxiety   . Smoker   . Polysubstance abuse     (+) cocaine 12/15/12   Past Surgical History: Past Surgical History  Procedure Laterality Date  . Tubal ligation  Social History: History  Substance Use Topics  . Smoking status: Current Every Day Smoker -- 25 years    Types: Cigarettes    Last Attempt to Quit: 12/04/2010  . Smokeless tobacco: Never Used  . Alcohol Use: No     Comment: drank when she used   Additional social history: None  Please also refer to relevant sections of EMR.  Family History: Family History  Problem Relation Age of Onset  . Heart disease Mother   . Hypertension Mother   . Coronary artery disease Mother    Allergies and Medications: No Known Allergies No current facility-administered medications on file prior to encounter.   Current Outpatient Prescriptions on File Prior to Encounter  Medication Sig Dispense Refill  . acyclovir (ZOVIRAX) 400 MG tablet Take 1 tablet (400 mg total) by mouth 2 (two) times daily. 60 tablet 1  . gabapentin (NEURONTIN) 300 MG capsule Take 1 capsule (300 mg total) by mouth 3 (three) times daily. 90 capsule 1  . HYDROcodone-acetaminophen (NORCO) 5-325 MG per tablet Take 1 tablet by mouth every 6 (six) hours as needed for moderate pain. 20 tablet 0  . insulin glargine (LANTUS) 100  UNIT/ML injection Inject 0.26 mLs (26 Units total) into the skin every morning. 1 vial 1  . insulin lispro (HUMALOG) 100 UNIT/ML injection Inject 0.06 mLs (6 Units total) into the skin 3 (three) times daily with meals. 10 mL 1  . metFORMIN (GLUCOPHAGE-XR) 750 MG 24 hr tablet Take 2 tablets (1,500 mg total) by mouth daily with supper. 60 tablet 11  . amoxicillin-clavulanate (AUGMENTIN) 875-125 MG per tablet Take 1 tablet by mouth every 12 (twelve) hours. (Patient not taking: Reported on 02/20/2015) 14 tablet 0  . cyclobenzaprine (FLEXERIL) 10 MG tablet Take 1 tablet (10 mg total) by mouth at bedtime. (Patient not taking: Reported on 02/20/2015) 4 tablet 0    Objective: BP 140/85 mmHg  Pulse 102  Temp(Src) 97.7 F (36.5 C) (Oral)  Resp 23  SpO2 100%  LMP 12/22/2014 (Approximate) Exam: General: NAD, AAOx3, Resting comfortably HEENT: NCAT, PERRLA, EOMI, Slightly tachy MM, No LAD, No thyromegaly or nodules.  Cardiovascular: Tachycardic, Regular rhythm, Normal S1/ S2, 2+ distal pulses  Respiratory: CTA Bilaterally, Appropriate rate, unlabored.  Abdomen: S, Mild TTP diffusely but predominantly in the upper quadrants, ND, +BS, No rebound, No gurading  Extremities: WWP, 2+ distal pulses bilaterally.  Skin: No rashes, no lesions  Neuro: AAOx3, Slightly decreased sensation over the plantar surface of her bilateral feet, Otherwise no focal deficits.   Labs and Imaging: CBC BMET   Recent Labs Lab 02/20/15 0621  WBC 9.3  HGB 16.1*  HCT 47.2*  PLT 243    Recent Labs Lab 02/20/15 0621  NA 133*  K 5.1  CL 89*  CO2 18*  BUN 16  CREATININE 1.52*  GLUCOSE 417*  CALCIUM 9.8     Urinalysis    Component Value Date/Time   COLORURINE YELLOW 02/20/2015 0545   APPEARANCEUR CLEAR 02/20/2015 0545   LABSPEC 1.036* 02/20/2015 0545   PHURINE 5.0 02/20/2015 0545   GLUCOSEU >1000* 02/20/2015 0545   HGBUR SMALL* 02/20/2015 0545   BILIRUBINUR NEGATIVE 02/20/2015 0545   BILIRUBINUR NEG 03/26/2013  1405   KETONESUR >80* 02/20/2015 0545   PROTEINUR 100* 02/20/2015 0545   PROTEINUR NEG 03/26/2013 1405   UROBILINOGEN 0.2 02/20/2015 0545   UROBILINOGEN 0.2 03/26/2013 1405   NITRITE NEGATIVE 02/20/2015 0545   NITRITE POSITIVE 03/26/2013 1405   LEUKOCYTESUR NEGATIVE 02/20/2015 0545  EKG - Right atrial enlargement, Anterior fascicular block.   Istat troponin - 0.0  Lipase - 19    Yolande Jolly, MD 02/20/2015, 11:19 AM PGY-1, [facility] Family Medicine FPTS Intern pager: [phone], text pages welcome

## 2015-02-20 NOTE — ED Notes (Signed)
Patient transported to X-ray 

## 2015-02-20 NOTE — ED Notes (Signed)
PT transported to bathroom attached to room via wheelchair. Pt had 2 episodes of vomiting while in restroom. Pt transported back to bed with 2 RNs. Pts clothing changed.

## 2015-02-20 NOTE — ED Provider Notes (Signed)
CSN: [id number]     Arrival date & time 02/20/15  0500 History   First MD Initiated Contact with Patient 02/20/15 0503     Chief Complaint  Patient presents with  . Emesis  . Hyperglycemia     (Consider location/radiation/quality/duration/timing/severity/associated sxs/prior Treatment) HPI  This is a 49 year old female with a history of poorly controlled diabetes, irregular heartbeat, hypertension who presents with nausea, vomiting, and hyperglycemia. Patient reports onset of symptoms on Thursday. She reports vomiting that is nonbilious and nonbloody. She denies any diarrhea. She reports diffuse abdominal pain. Patient states that her sugars have been "high" and greater than 200. She reports that she is taking her insulin and what metformin as prescribed. However, patient reports that when her sugars are high, she commonly will get vomiting. She denies any fevers. She denies any urinary symptoms.  Current pain is 8 out of 10.  Past Medical History  Diagnosis Date  . Diabetes mellitus     A1C 15.8   12/17/12  . Irregular heart beat   . Depression     Was taking abilify, celexa  . Herpes     Takes acyclovir BID  . Narcotic abuse     Attends NA mtgs  . Hypertension   . Anxiety   . Smoker   . Polysubstance abuse     (+) cocaine 12/15/12   Past Surgical History  Procedure Laterality Date  . Tubal ligation     Family History  Problem Relation Age of Onset  . Heart disease Mother   . Hypertension Mother   . Coronary artery disease Mother    History  Substance Use Topics  . Smoking status: Current Every Day Smoker -- 25 years    Types: Cigarettes    Last Attempt to Quit: 12/04/2010  . Smokeless tobacco: Never Used  . Alcohol Use: No     Comment: drank when she used   OB History    Gravida Para Term Preterm AB TAB SAB Ectopic Multiple Living   4 2 0  Review of Systems  Constitutional: Negative for fever.  Respiratory: Negative for cough, chest tightness  and shortness of breath.   Cardiovascular: Negative for chest pain.  Gastrointestinal: Positive for nausea, vomiting and abdominal pain. Negative for diarrhea and constipation.  Genitourinary: Negative for dysuria.  Musculoskeletal: Negative for back pain.  Skin: Negative for wound.  Neurological: Negative for headaches.  Psychiatric/Behavioral: Negative for confusion.  All other systems reviewed and are negative.     Allergies  Review of patient's allergies indicates no known allergies.  Home Medications   Prior to Admission medications   Medication Sig Start Date End Date Taking? Authorizing Provider  acyclovir (ZOVIRAX) 400 MG tablet Take 1 tablet (400 mg total) by mouth 2 (two) times daily. 12/30/14  Yes Latrelle Dodrill, MD  gabapentin (NEURONTIN) 300 MG capsule Take 1 capsule (300 mg total) by mouth 3 (three) times daily. 12/29/14  Yes Latrelle Dodrill, MD  HYDROcodone-acetaminophen (NORCO) 5-325 MG per tablet Take 1 tablet by mouth every 6 (six) hours as needed for moderate pain. 01/22/15  Yes Tatyana A Kirichenko, PA-C  insulin glargine (LANTUS) 100 UNIT/ML injection Inject 0.26 mLs (26 Units total) into the skin every morning. 12/24/14  Yes Raeford Razor, MD  insulin lispro (HUMALOG) 100 UNIT/ML injection Inject 0.06 mLs (6 Units total) into the skin 3 (three) times daily with meals. 12/24/14  Yes Raeford Razor, MD  metFORMIN (GLUCOPHAGE-XR) 750 MG 24 hr tablet Take 2 tablets (1,500 mg total) by mouth daily with supper. 10/29/13  Yes Charm RingsErin J Honig, MD  amoxicillin-clavulanate (AUGMENTIN) 875-125 MG per tablet Take 1 tablet by mouth every 12 (twelve) hours. Patient not taking: Reported on 02/20/2015 01/22/15   Tatyana A Kirichenko, PA-C  cyclobenzaprine (FLEXERIL) 10 MG tablet Take 1 tablet (10 mg total) by mouth at bedtime. Patient not taking: Reported on 02/20/2015 12/08/14   Mellody DrownLauren Parker, PA-C  doxycycline (VIBRA-TABS) 100 MG tablet Take 1 tablet (100 mg total) by mouth 2 (two) times  daily. Patient not taking: Reported on 12/24/2014 05/14/14   Stephanie Couphristopher M Street, MD  fluconazole (DIFLUCAN) 150 MG tablet Take 1 tablet (150 mg total) by mouth once. Take 2nd tablet on Day 3 if symptoms persist. (start after antibiotic, only if yeast infection) Patient not taking: Reported on 02/20/2015 12/22/14   Saralyn PilarAlexander Karamalegos, DO  metroNIDAZOLE (FLAGYL) 500 MG tablet Take 1 tablet (500 mg total) by mouth 2 (two) times daily. Do not drink alcohol while taking this medicine. Patient not taking: Reported on 02/20/2015 12/22/14   Saralyn PilarAlexander Karamalegos, DO   BP 130/82 mmHg  Pulse 99  Temp(Src) 97.7 F (36.5 C) (Oral)  Resp 13  SpO2 100%  LMP 12/22/2014 (Approximate) Physical Exam  Constitutional: She is oriented to person, place, and time.  Overweight  HENT:  Head: Normocephalic and atraumatic.  Mucous membranes dry  Cardiovascular: Regular rhythm and normal heart sounds.   Tachycardia  Pulmonary/Chest: Effort normal and breath sounds normal. No respiratory distress. She has no wheezes.  Abdominal: Soft. Bowel sounds are normal.  Mild diffuse tenderness to palpation, no rebound or guarding, no obvious distention, no signs of peritonitis  Musculoskeletal: She exhibits no edema.  Neurological: She is alert and oriented to person, place, and time.  Skin: Skin is warm and dry.  Psychiatric: She has a normal mood and affect.  Nursing note and vitals reviewed.   ED Course  Procedures (including critical care time)  CRITICAL CARE Performed by: Ross MarcusHORTON, Mitch Arquette, F   Total critical care time: 30 min  Critical care time was exclusive of separately billable procedures and treating other patients.  Critical care was necessary to treat or prevent imminent or life-threatening deterioration.  Critical care was time spent personally by me on the following activities: development of treatment plan with patient and/or surrogate as well as nursing, discussions with consultants, evaluation of  patient's response to treatment, examination of patient, obtaining history from patient or surrogate, ordering and performing treatments and interventions, ordering and review of laboratory studies, ordering and review of radiographic studies, pulse oximetry and re-evaluation of patient's condition.  Labs Review Labs Reviewed  CBC WITH DIFFERENTIAL/PLATELET - Abnormal; Notable for the following:    RBC 5.60 (*)    Hemoglobin 16.1 (*)    HCT 47.2 (*)    All other components within normal limits  COMPREHENSIVE METABOLIC PANEL - Abnormal; Notable for the following:    Sodium 133 (*)    Chloride 89 (*)    CO2 18 (*)    Glucose, Bld 417 (*)    Creatinine, Ser 1.52 (*)    Total Bilirubin 1.3 (*)    GFR calc non Af Amer 40 (*)    GFR calc Af Amer 46 (*)    Anion gap 26 (*)    All other components within normal limits  URINALYSIS, ROUTINE W REFLEX MICROSCOPIC - Abnormal; Notable for the following:    Specific Gravity,  Urine 1.036 (*)    Glucose, UA >1000 (*)    Hgb urine dipstick SMALL (*)    Ketones, ur >80 (*)    Protein, ur 100 (*)    All other components within normal limits  URINE MICROSCOPIC-ADD ON - Abnormal; Notable for the following:    Squamous Epithelial / LPF FEW (*)    All other components within normal limits  CBG MONITORING, ED - Abnormal; Notable for the following:    Glucose-Capillary 370 (*)    All other components within normal limits  LIPASE, BLOOD  I-STAT TROPOININ, ED  POC URINE PREG, ED    Imaging Review No results found.   EKG Interpretation EKG independently reviewed by myself: Sinus tachycardia with a rate of 100, no evidence of acute ST elevation      MDM   Final diagnoses:  Nausea and vomiting, vomiting of unspecified type  Hyperglycemia  Diabetic ketoacidosis without coma associated with diabetes mellitus due to underlying condition    Patient presents with vomiting and abdominal pain as well as hyperglycemia. Nontoxic on exam. Tachycardic,  appears dry. No signs of peritonitis. Workup notable for an anion gap of 26 with a glucose of 471. Patient was given 2 L of normal saline, Zofran, and 10 units of insulin initially. On recheck, patient still vomiting. Patient given Phenergan and insulin drip was initiated. No evidence of infection. EKG without evidence of infarction. Patient denies any other infectious symptoms. Patient appears to be an acute DKA. Will admit to the family medicine service.    Shon Baton, MD 02/20/15 [phone]

## 2015-02-20 NOTE — ED Notes (Signed)
MD at bedside. 

## 2015-02-20 NOTE — ED Notes (Signed)
PT states that she started vomiting on Thursday evening, woke up Friday morning and ate, and she vomited that up. She is also unable to tolerate water. Pt has been taking diabetic medications.

## 2015-02-21 DIAGNOSIS — R112 Nausea with vomiting, unspecified: Secondary | ICD-10-CM | POA: Insufficient documentation

## 2015-02-21 DIAGNOSIS — R748 Abnormal levels of other serum enzymes: Secondary | ICD-10-CM

## 2015-02-21 DIAGNOSIS — A084 Viral intestinal infection, unspecified: Secondary | ICD-10-CM

## 2015-02-21 DIAGNOSIS — R7989 Other specified abnormal findings of blood chemistry: Secondary | ICD-10-CM | POA: Insufficient documentation

## 2015-02-21 DIAGNOSIS — E1310 Other specified diabetes mellitus with ketoacidosis without coma: Principal | ICD-10-CM

## 2015-02-21 LAB — TROPONIN I: Troponin I: <0.03 ng/mL (ref <0.031)

## 2015-02-21 LAB — BASIC METABOLIC PANEL
ANION GAP: 4 — AB (ref 5–15)
BUN: 9 mg/dL (ref 6–23)
CHLORIDE: 106 mmol/L (ref 96–112)
CO2: 25 mmol/L (ref 19–32)
CREATININE: 0.87 mg/dL (ref 0.50–1.10)
Calcium: 8.7 mg/dL (ref 8.4–10.5)
GFR calc Af Amer: >90 mL/min (ref >90)
GFR calc non Af Amer: 78 mL/min — ABNORMAL LOW (ref >90)
GLUCOSE: 171 mg/dL — AB (ref 70–99)
POTASSIUM: 3.4 mmol/L — AB (ref 3.5–5.1)
Sodium: 135 mmol/L (ref 135–145)

## 2015-02-21 LAB — GLUCOSE, CAPILLARY
GLUCOSE-CAPILLARY: 144 mg/dL — AB (ref 70–99)
GLUCOSE-CAPILLARY: 151 mg/dL — AB (ref 70–99)
GLUCOSE-CAPILLARY: 162 mg/dL — AB (ref 70–99)
GLUCOSE-CAPILLARY: 193 mg/dL — AB (ref 70–99)
Glucose-Capillary: 165 mg/dL — ABNORMAL HIGH (ref 70–99)
Glucose-Capillary: 142 mg/dL — ABNORMAL HIGH (ref 70–99)
Glucose-Capillary: 131 mg/dL — ABNORMAL HIGH (ref 70–99)
Glucose-Capillary: 143 mg/dL — ABNORMAL HIGH (ref 70–99)
Glucose-Capillary: 164 mg/dL — ABNORMAL HIGH (ref 70–99)
Glucose-Capillary: 192 mg/dL — ABNORMAL HIGH (ref 70–99)
Glucose-Capillary: 110 mg/dL — ABNORMAL HIGH (ref 70–99)
Glucose-Capillary: 133 mg/dL — ABNORMAL HIGH (ref 70–99)
Glucose-Capillary: 162 mg/dL — ABNORMAL HIGH (ref 70–99)
Glucose-Capillary: 160 mg/dL — ABNORMAL HIGH (ref 70–99)

## 2015-02-21 MED ORDER — SODIUM CHLORIDE 0.9 % IJ SOLN
3.0000 mL | Freq: Two times a day (BID) | INTRAMUSCULAR | Status: DC
  Administered 2015-02-21: 3 mL via INTRAVENOUS

## 2015-02-21 MED ORDER — SODIUM CHLORIDE 0.9 % IJ SOLN: 3.0000 mL | INTRAMUSCULAR | Status: DC | PRN

## 2015-02-21 MED ORDER — INSULIN ASPART 100 UNIT/ML ~~LOC~~ SOLN
0.0000 [IU] | Freq: Three times a day (TID) | SUBCUTANEOUS | Status: DC
  Administered 2015-02-21 (×2): 3 [IU] via SUBCUTANEOUS
  Administered 2015-02-21: 2 [IU] via SUBCUTANEOUS
  Administered 2015-02-22: 15 [IU] via SUBCUTANEOUS
  Administered 2015-02-22: 8 [IU] via SUBCUTANEOUS

## 2015-02-21 MED ORDER — SODIUM CHLORIDE 0.9 % IV SOLN
INTRAVENOUS | Status: DC
  Administered 2015-02-21: 10 mL via INTRAVENOUS

## 2015-02-21 MED ORDER — POTASSIUM CHLORIDE CRYS ER 20 MEQ PO TBCR
40.0000 meq | EXTENDED_RELEASE_TABLET | ORAL | Status: AC
  Administered 2015-02-21 (×3): 40 meq via ORAL
  Filled 2015-02-21 (×3): qty 2

## 2015-02-21 MED ORDER — ONDANSETRON HCL 4 MG PO TABS: 4.0000 mg | ORAL_TABLET | Freq: Three times a day (TID) | ORAL | Status: DC | PRN

## 2015-02-21 MED ORDER — SODIUM CHLORIDE 0.9 % IV SOLN
250.0000 mL | INTRAVENOUS | Status: DC | PRN
  Administered 2015-02-21: 250 mL via INTRAVENOUS

## 2015-02-21 MED ORDER — INSULIN GLARGINE 100 UNIT/ML ~~LOC~~ SOLN
26.0000 [IU] | Freq: Every day | SUBCUTANEOUS | Status: DC
  Administered 2015-02-21 – 2015-02-22 (×2): 26 [IU] via SUBCUTANEOUS
  Filled 2015-02-21 (×2): qty 0.26

## 2015-02-21 MED ORDER — MORPHINE SULFATE 2 MG/ML IJ SOLN
1.0000 mg | INTRAMUSCULAR | Status: DC | PRN
  Administered 2015-02-21 (×2): 1 mg via INTRAVENOUS
  Filled 2015-02-21 (×2): qty 1

## 2015-02-21 MED ORDER — METFORMIN HCL ER 500 MG PO TB24
1500.0000 mg | ORAL_TABLET | Freq: Every day | ORAL | Status: DC
  Administered 2015-02-21: 1500 mg via ORAL
  Filled 2015-02-21 (×2): qty 3
  Filled 2015-02-21: qty 2

## 2015-02-21 MED ORDER — ACETAMINOPHEN 325 MG PO TABS
650.0000 mg | ORAL_TABLET | Freq: Four times a day (QID) | ORAL | Status: DC | PRN
  Administered 2015-02-21: 650 mg via ORAL
  Filled 2015-02-21: qty 2

## 2015-02-21 NOTE — Progress Notes (Signed)
Utilization Review Completed.   Torryn Fiske, RN, BSN Nurse Case Manager  

## 2015-02-21 NOTE — Progress Notes (Addendum)
Received orders to transfer pt to med-surg, pt aware of transfer. Attempted to call report to RN on 5N, awaiting call back. No s/s of acute distress noted.  1155: report called to RN on 5N. VS stable. Will transfer pt.

## 2015-02-21 NOTE — Progress Notes (Signed)
Family Medicine Teaching Service Daily Progress Note Intern Pager: [phone]  Patient name: Patricia Charles Medical record number: [id number] Date of birth: 09/25/1966 Age: 49 y.o. Gender: female  Primary Care Provider: Levert FeinsteinMcIntyre, Brittany, MD Consultants: none Code Status: full  Pt Overview and Major Events to Date:  3/5 >> Mild DKA in setting of likely GI bug 3/6 >> Transitioned to home insulin; Advance diet  Assessment and Plan: Patricia Charles is a 49 y.o. female presenting with Nausea / Vomiting for 2 days and found to be in Mild DKA. PMH is significant for Poorly Controlled DMII with peripheral neuropathy, HTN, Irregular Heart Beat, Herpes Infection  # Mild Diabetic Ketoacidosis - Improving. Pt. Here with likely viral gastritis induced vomiting for two days tipping off mild DKA vs Cocaine induced (UDS +). Never been in DKA before. Pt. Poorly controlled DMII by last A1C of 9. Home regimen of 26 u of lantus daily, 6U TID QC Novolog, Metformin 1500 qhs. Bicarb - 18 here, Glucose 449 on admission, GAP of 26 on admission, though widened by hypochloremia (likely due to vomiting). WBC normal, Afebrile, No SOB, No other prodromal viral illness leading up to vomiting, no aches / chills at home. Istat troponin negative. EKG unchanged from baseline, but irregular heartbeat noted. Corrected Na - 140, K - 5.1. Lipase 19. Hgb elevated (likely hemoconcentrated). Given 2 L NS in the ED. Started in insulin drip. Vital signs stable.  - Gabapentin for peripheral neuropathy.  - Hgb A1C: pending - Restart home metformin - Transition to home Lantus 26u qam (3/6 @ 4:30am) - SSI w/ AC  - KVO fluids and advance diet - BMET in am  # Nausea / Vomiting - Improved Pt. With no recollection of viral prodrome. Afebrile at home, does complain of aches intermittently. Pain predominantly abdominal. Denies eating questionable food. Denies sick contacts. No one else has gotten sick that she knows of. She does not spend time  with small children. No other risk factors at this time. Vomitus with food transitioned to yellow / green and then dry heaving. Vomiting approximately 4 times / day. WBC normal, afebrile here, giving fluids as above. Lipase normal, Hypochloremic with slightly low bicarb as above. Vital signs stable.  - Likely viral gastroenteritis vs DKA - PO zofran - advance diet as tolerated  # HTN - Historical. Not on medications at home.  - BP remains slightly elevated - 140-150s/ 90-100 - Continue to monitor; Consider ACE-I vs HCTZ if remains elevated  # HLD - LDL 201 (02/24/15) - Would benefit from statin. Consider starting this admission vs as outpt at PCP apt  # Sinus tach - Resolved w/ IVF EKG with evidence of R atrial enlargement, Right anterior fascicular block. Slightly tachycardic (in the setting of vomiting / acute illness). EKG similar to previous one year ago. Never been worked up given evidence of cardiac changes in the setting of DMII and concern for RA enlargement. No exam findings other than tachycardia. likely due to UDS + Cocaine - Repeat EKG: unchanged - Troponins x 3: Negative  - Consider Echo this admission.  - TSH: mildly low 0.304 - Recheck when not acutely ill  # Kidney Injury - Resolved. Pt. With prior history of normal Cr and GFR. Since 1/ 2016 Cr is up to 1.5 and GFR down to the 40's. With hx of DM and past hx of HTN. Could be new baseline with CKD III.  - follow BMET as needed - Note on D/C summary for close follow  up as an outpatient; PCP to recheck UA for protein (+ this admit) & microalbuminuria if negative    FEN/GI: KVO fluids; Carb mod diet Prophylaxis: heparin   Disposition: Dispo pending PO intake  Subjective:  Report feeling better today w/ mild nausea but no more vomiting. Will attempt to eat breakfast.   Objective: Temp:  [98.4 F (36.9 C)-99.6 F (37.6 C)] 99.3 F (37.4 C) (03/06 0710) Pulse Rate:  [87-106] 87 (03/06 0710) Resp:  [17-36] 17 (03/06  0710) BP: (140-162)/(78-122) 147/81 mmHg (03/06 0710) SpO2:  [92 %-100 %] 100 % (03/06 0710) Weight:  [205 lb 14.6 oz (93.4 kg)] 205 lb 14.6 oz (93.4 kg) (03/05 1612) Physical Exam: General: Pleasant female HEENT: MMM Cardiac: Regular rate and rhythm, S1 and S2 present, no murmurs, no heaves or thrills  Respiratory: Clear to auscultation bilaterally, normal effort Abdomen: Soft, NTND Extremities: 2+ radial and dorsalis pedis pulses, no lower extremity edema Skin: No rash  Laboratory:  Recent Labs Lab 02/20/15 0621 02/20/15 1047 02/20/15 1419  WBC 9.3 8.7 8.5  HGB 16.1* 15.3* 15.7*  HCT 47.2* 45.0 46.4*  PLT 243 254 252    Recent Labs Lab 02/20/15 0621  02/20/15 1833 02/20/15 2240 02/21/15 0450  NA 133*  < > 139 137 135  K 5.1  < > 3.9 3.4* 3.4*  CL 89*  < > 105 104 106  CO2 18*  < > BUN 16  < > CREATININE 1.52*  < > 0.93 1.08 0.87  CALCIUM 9.8  < > 9.0 8.8 8.7  PROT 7.8  --   --   --   --   BILITOT 1.3*  --   --   --   --   ALKPHOS 99  --   --   --   --   ALT 15  --   --   --   --   AST 15  --   --   --   --   GLUCOSE 417*  < > 144* 157* 171*  < > = values in this interval not displayed.    Recent Labs Lab 02/20/15 1047 02/20/15 1833 02/20/15 2240 02/21/15 0451  TROPONINI <0.03 <0.03 <0.03 <0.03   Lipid Panel     Component Value Date/Time   CHOL 286* 02/20/2015 1419   TRIG 108 02/20/2015 1419   HDL 63 02/20/2015 1419   CHOLHDL 4.5 02/20/2015 1419   VLDL 22 02/20/2015 1419   LDLCALC 201* 02/20/2015 1419   Drugs of Abuse     Component Value Date/Time   LABOPIA NONE DETECTED 02/20/2015 0545   COCAINSCRNUR POSITIVE* 02/20/2015 0545   LABBENZ NONE DETECTED 02/20/2015 0545   AMPHETMU NONE DETECTED 02/20/2015 0545   THCU NONE DETECTED 02/20/2015 0545   LABBARB NONE DETECTED 02/20/2015 0545     Imaging/Diagnostic Tests: CXR: No active cardiopulmonary disease.  Jamal Collin, MD 02/21/2015, 11:00 AM PGY-2, [facility] FPTS Intern pager: [phone], text pages welcome

## 2015-02-22 DIAGNOSIS — R079 Chest pain, unspecified: Secondary | ICD-10-CM

## 2015-02-22 DIAGNOSIS — R739 Hyperglycemia, unspecified: Secondary | ICD-10-CM

## 2015-02-22 LAB — BASIC METABOLIC PANEL
Anion gap: 8 (ref 5–15)
BUN: 10 mg/dL (ref 6–23)
CHLORIDE: 102 mmol/L (ref 96–112)
CO2: 23 mmol/L (ref 19–32)
Calcium: 8.6 mg/dL (ref 8.4–10.5)
Creatinine, Ser: 0.93 mg/dL (ref 0.50–1.10)
GFR calc Af Amer: 84 mL/min — ABNORMAL LOW (ref >90)
GFR, EST NON AFRICAN AMERICAN: 72 mL/min — AB (ref >90)
GLUCOSE: 297 mg/dL — AB (ref 70–99)
POTASSIUM: 4.2 mmol/L (ref 3.5–5.1)
Sodium: 133 mmol/L — ABNORMAL LOW (ref 135–145)

## 2015-02-22 LAB — GLUCOSE, CAPILLARY
GLUCOSE-CAPILLARY: 375 mg/dL — AB (ref 70–99)
Glucose-Capillary: 274 mg/dL — ABNORMAL HIGH (ref 70–99)

## 2015-02-22 LAB — HEMOGLOBIN A1C
HEMOGLOBIN A1C: 14.8 % — AB (ref 4.8–5.6)
MEAN PLASMA GLUCOSE: 378 mg/dL

## 2015-02-22 NOTE — Discharge Summary (Signed)
Family Medicine Teaching [facility] Discharge Summary  Patient name: Patricia Charles Medical record number: [id number] Date of birth: 12/03/1966 Age: 49 y.o. Gender: female Date of Admission: 02/20/2015  Date of Discharge: 02/22/15 Admitting Physician: Uvaldo RisingKyle J Fletke, MD  Primary Care Provider: Levert FeinsteinMcIntyre, Brittany, MD Consultants: none  Indication for Hospitalization: DKA  Discharge Diagnoses/Problem List:  Diabetic ketoacidosis Intractable nausea and vomiting Hypertension Hyperlipidemia Sinus tachycardia Acute kidney injury  Disposition: home  Discharge Condition: Stable  Brief Hospital Course:  Patient is a 49 year old female who presented with a 2 day history of nausea and vomiting. She was noted to have Nissen hyperglycemia in the ED. Patient endorsed significant abdominal pain on admission. Pain was described as achy in nature. Pain worsened with retching. Patient reported CBGs at home between 120-160. She reported good compliance with her insulin medications. She denied any vision changes headaches chest pain or shortness of breath on admission.  In the ED patient was noted to have hyperglycemia into the 400s. Vital signs were stable although patient was slightly tachycardic. Anion gap was elevated. Patient was started on insulin drip and was admitted to step down while the insulin drip was in place.  The following day, 3/6, CBGs were under control and anion gap closed. Patient was transitioned out of the stepdown unit and onto her home insulin regimen. She continued to endorse nausea, but had no vomiting. Appetite was significantly diminished. Patient tolerated this transition well and was kept overnight due to her inability to take anything by mouth.  On 3/7 patient's appetite was back. She had one high CBG of 274 but was otherwise fairly well under control. Patient stated her readiness for discharge.   One issue noted on admission was patient had a irregular heart rate with a  left anterior fascicular block. EKG was relatively unchanged from prior, however it was noted that workup was warranted. Echocardiogram was obtained on 3/7. This yielded results of mild tricuspid regurgitation and an EF of 65-70%.  Issues for Follow Up:  1. Medication compliance both inside and outside the setting of acute illnesses. 2. Patient was noted to have consistent hypertensive blood pressures throughout her stay. Consideration to start antihypertensive therapy may be warranted. (ACEi? Vs. Thiazide?)  Significant Procedures: Echocardiogram  Significant Labs and Imaging:   Recent Labs Lab 02/20/15 0621 02/20/15 1047 02/20/15 1419  WBC 9.3 8.7 8.5  HGB 16.1* 15.3* 15.7*  HCT 47.2* 45.0 46.4*  PLT 243 254 252    Recent Labs Lab 02/20/15 0621 02/20/15 1047 02/20/15 1419 02/20/15 1833 02/20/15 2240 02/21/15 0450 02/22/15 0626  NA 133* 139 140 139 137 135 133*  K 5.1 3.6 4.1 3.9 3.4* 3.4* 4.2  CL 89* 101 103 105 104 106 102  CO2 18* 19 25 26 29 25 23   GLUCOSE 417* 241* 224* 144* 157* 171* 297*  BUN 16 14 13 12 11 9 10   CREATININE 1.52* 1.24* 1.17* 0.93 1.08 0.87 0.93  CALCIUM 9.8 8.4 9.3 9.0 8.8 8.7 8.6  MG  --  1.8  --   --   --   --   --   PHOS  --  3.2  --   --   --   --   --   ALKPHOS 99  --   --   --   --   --   --   AST 15  --   --   --   --   --   --   ALT 15  --   --   --   --   --   --  ALBUMIN 3.7  --   --   --   --   --   --    Hemoglobin A1c: 14.8   Results/Tests Pending at Time of Discharge: None  Discharge Medications:    Medication List    STOP taking these medications        amoxicillin-clavulanate 875-125 MG per tablet  Commonly known as:  AUGMENTIN      TAKE these medications        acyclovir 400 MG tablet  Commonly known as:  ZOVIRAX  Take 1 tablet (400 mg total) by mouth 2 (two) times daily.     cyclobenzaprine 10 MG tablet  Commonly known as:  FLEXERIL  Take 1 tablet (10 mg total) by mouth at bedtime.     gabapentin 300 MG  capsule  Commonly known as:  NEURONTIN  Take 1 capsule (300 mg total) by mouth 3 (three) times daily.     HYDROcodone-acetaminophen 5-325 MG per tablet  Commonly known as:  NORCO  Take 1 tablet by mouth every 6 (six) hours as needed for moderate pain.     insulin glargine 100 UNIT/ML injection  Commonly known as:  LANTUS  Inject 0.26 mLs (26 Units total) into the skin every morning.     insulin lispro 100 UNIT/ML injection  Commonly known as:  HUMALOG  Inject 0.06 mLs (6 Units total) into the skin 3 (three) times daily with meals.     metFORMIN 750 MG 24 hr tablet  Commonly known as:  GLUCOPHAGE-XR  Take 2 tablets (1,500 mg total) by mouth daily with supper.        Discharge Instructions: Please refer to Patient Instructions section of EMR for full details.  Patient was counseled important signs and symptoms that should prompt return to medical care, changes in medications, dietary instructions, activity restrictions, and follow up appointments.   Follow-Up Appointments: Follow-up Information    Follow up with Levert Feinstein, MD. Go on 02/24/2015.   Specialty:  Family Medicine   Why:  @ 2:00pm   Contact information:   [address] [place] [address] [phone]       Kathee Delton, MD 02/22/2015, 11:27 AM PGY-1, [facility]

## 2015-02-22 NOTE — Discharge Instructions (Signed)
It is very important that you continue to take your medications everyday and to try your best to be consistent with your meals (time you eat, how much you eat, and the amount of carbohydrates in each meal).  Blood Glucose Monitoring Monitoring your blood glucose (also know as blood sugar) helps you to manage your diabetes. It also helps you and your health care provider monitor your diabetes and determine how well your treatment plan is working. WHY SHOULD YOU MONITOR YOUR BLOOD GLUCOSE?  It can help you understand how food, exercise, and medicine affect your blood glucose.  It allows you to know what your blood glucose is at any given moment. You can quickly tell if you are having low blood glucose (hypoglycemia) or high blood glucose (hyperglycemia).  It can help you and your health care provider know how to adjust your medicines.  It can help you understand how to manage an illness or adjust medicine for exercise. WHEN SHOULD YOU TEST? Your health care provider will help you decide how often you should check your blood glucose. This may depend on the type of diabetes you have, your diabetes control, or the types of medicines you are taking. Be sure to write down all of your blood glucose readings so that this information can be reviewed with your health care provider. See below for examples of testing times that your health care provider may suggest. Type 1 Diabetes  Test 4 times a day if you are in good control, using an insulin pump, or perform multiple daily injections.  If your diabetes is not well controlled or if you are sick, you may need to monitor more often.  It is a good idea to also monitor:  Before and after exercise.  Between meals and 2 hours after a meal.  Occasionally between 2:00 a.m. and 3:00 a.m. Type 2 Diabetes  It can vary with each person, but generally, if you are on insulin, test 4 times a day.  If you take medicines by mouth (orally), test 2 times a  day.  If you are on a controlled diet, test once a day.  If your diabetes is not well controlled or if you are sick, you may need to monitor more often. HOW TO MONITOR YOUR BLOOD GLUCOSE Supplies Needed  Blood glucose meter.  Test strips for your meter. Each meter has its own strips. You must use the strips that go with your own meter.  A pricking needle (lancet).  A device that holds the lancet (lancing device).  A journal or log book to write down your results. Procedure  Wash your hands with soap and water. Alcohol is not preferred.  Prick the side of your finger (not the tip) with the lancet.  Gently milk the finger until a small drop of blood appears.  Follow the instructions that come with your meter for inserting the test strip, applying blood to the strip, and using your blood glucose meter. Other Areas to Get Blood for Testing Some meters allow you to use other areas of your body (other than your finger) to test your blood. These areas are called alternative sites. The most common alternative sites are:  The forearm.  The thigh.  The back area of the lower leg.  The palm of the hand. The blood flow in these areas is slower. Therefore, the blood glucose values you get may be delayed, and the numbers are different from what you would get from your fingers. Do not use  alternative sites if you think you are having hypoglycemia. Your reading will not be accurate. Always use a finger if you are having hypoglycemia. Also, if you cannot feel your lows (hypoglycemia unawareness), always use your fingers for your blood glucose checks. ADDITIONAL TIPS FOR GLUCOSE MONITORING  Do not reuse lancets.  Always carry your supplies with you.  All blood glucose meters have a 24-hour "hotline" number to call if you have questions or need help.  Adjust (calibrate) your blood glucose meter with a control solution after finishing a few boxes of strips. BLOOD GLUCOSE RECORD KEEPING It  is a good idea to keep a daily record or log of your blood glucose readings. Most glucose meters, if not all, keep your glucose records stored in the meter. Some meters come with the ability to download your records to your home computer. Keeping a record of your blood glucose readings is especially helpful if you are wanting to look for patterns. Make notes to go along with the blood glucose readings because you might forget what happened at that exact time. Keeping good records helps you and your health care provider to work together to achieve good diabetes management.  Document Released: 12/07/2003 Document Revised: 04/20/2014 Document Reviewed: 04/28/2013 [facility]: [facility] Rhonda W Patient Information 2015 [place], [place]. This information is not intended to replace advice given to you by your health care provider. Make sure you discuss any questions you have with your health care provider.

## 2015-02-22 NOTE — Progress Notes (Signed)
  Echocardiogram 2D Echocardiogram has been performed.  Leta JunglingCooper, Kieryn Burtis M 02/22/2015, 12:52 PM

## 2015-02-22 NOTE — Progress Notes (Signed)
Family Medicine Teaching Service Daily Progress Note Intern Pager: [phone]  Patient name: Patricia Charles Medical record number: [id number] Date of birth: 08/09/1966 Age: 49 y.o. Gender: female  Primary Care Provider: Levert Feinstein, MD Consultants: none Code Status: full  Pt Overview and Major Events to Date:  3/5 >> Mild DKA in setting of likely GI bug 3/6 >> Transitioned to home insulin; Advance diet  Assessment and Plan: Patricia Charles is a 49 y.o. female presenting with Nausea / Vomiting for 2 days and found to be in Mild DKA. PMH is significant for Poorly Controlled DMII with peripheral neuropathy, HTN, Irregular Heart Beat, Herpes Infection  # Mild Diabetic Ketoacidosis - Improving. Pt. Here with likely viral gastritis induced vomiting for two days tipping off mild DKA vs Cocaine induced (UDS +). Never been in DKA before. Pt. Poorly controlled DMII by last A1C of 9. Home regimen of 26 u of lantus daily, 6U TID QC Novolog, Metformin 1500 qhs. Bicarb - 18 here, Glucose 449 on admission, GAP of 26 on admission, though widened by hypochloremia (likely due to vomiting). WBC normal, Afebrile, No SOB, No other prodromal viral illness leading up to vomiting, no aches / chills at home. Istat troponin negative. EKG unchanged from baseline, but irregular heartbeat noted. Corrected Na - 140, K - 5.1. Lipase 19. Hgb elevated (likely hemoconcentrated). Given 2 L NS in the ED. Started in insulin drip. Vital signs stable.  - Gabapentin for peripheral neuropathy.  - Hgb A1C: pending - Restart home metformin - Transitioned to home Lantus 26u qam (3/6 @ 4:30am) - SSI w/ AC  - KVO fluids and advance diet - BMET in am  # Nausea / Vomiting - Improved Pt. With no recollection of viral prodrome. Afebrile at home, does complain of aches intermittently. Pain predominantly abdominal. Denies eating questionable food. Denies sick contacts. No one else has gotten sick that she knows of. She does not spend  time with small children. No other risk factors at this time. Vomitus with food transitioned to yellow / green and then dry heaving. Vomiting approximately 4 times / day. WBC normal, afebrile here, giving fluids as above. Lipase normal, Hypochloremic with slightly low bicarb as above. Vital signs stable.  - Likely viral gastroenteritis vs DKA - PO zofran - advance diet as tolerated  # HTN - Historical. Not on medications at home.  - BP remains slightly elevated - 140-150s/ 90-100 - Continue to monitor; Consider ACE-I vs HCTZ if remains elevated  # HLD - LDL 201 (02/24/15) - Would benefit from statin. Consider starting this admission vs as outpt at PCP apt  # Sinus tach - Resolved w/ IVF EKG with evidence of R atrial enlargement, Left anterior fascicular block. Slightly tachycardic (in the setting of vomiting / acute illness). EKG similar to previous one year ago. Never been worked up given evidence of cardiac changes in the setting of DMII and concern for RA enlargement. No exam findings other than tachycardia. likely due to UDS + Cocaine - Repeat EKG: unchanged - Troponins x 3: Negative  - TSH: mildly low 0.304 - Recheck when not acutely ill - Echo ordered 3/7  # Kidney Injury - Resolved. Pt. With prior history of normal Cr and GFR. Since 1/ 2016 Cr is up to 1.5 and GFR down to the 40's. With hx of DM and past hx of HTN. Could be new baseline with CKD III.  - follow BMET as needed - Cr 0.93 (3/7) - Note on D/C summary for  close follow up as an outpatient; PCP to recheck UA for protein (+ this admit) & microalbuminuria if negative    FEN/GI: KVO fluids; Carb mod diet Prophylaxis: heparin   Disposition: Dispo likely today.  Subjective:  Report feeling better today no n/v. Was able to eat breakfast today. Would like to go home.  Objective: Temp:  [97.7 F (36.5 C)-98.9 F (37.2 C)] 97.8 F (36.6 C) (03/07 0646) Pulse Rate:  [88-105] 88 (03/07 0646) Resp:  [16-25] 16 (03/07  0646) BP: (124-175)/(87-99) 140/93 mmHg (03/07 0646) SpO2:  [100 %] 100 % (03/07 0646) Physical Exam: General: Pleasant female, NAD, sitting up in bed HEENT: MMM, EOMI Cardiac: Regular rate and rhythm, S1 and S2 present, no murmurs, no heaves or thrills  Respiratory: Clear to auscultation bilaterally, normal effort Abdomen: Soft, NTND Extremities: 2+ radial and dorsalis pedis pulses, no lower extremity edema Skin: No rash  Laboratory:  Recent Labs Lab 02/20/15 0621 02/20/15 1047 02/20/15 1419  WBC 9.3 8.7 8.5  HGB 16.1* 15.3* 15.7*  HCT 47.2* 45.0 46.4*  PLT 243 254 252    Recent Labs Lab 02/20/15 0621  02/20/15 1833 02/20/15 2240 02/21/15 0450  NA 133*  < > 139 137 135  K 5.1  < > 3.9 3.4* 3.4*  CL 89*  < > 105 104 106  CO2 18*  < > 26 29 25   BUN 16  < > 12 11 9   CREATININE 1.52*  < > 0.93 1.08 0.87  CALCIUM 9.8  < > 9.0 8.8 8.7  PROT 7.8  --   --   --   --   BILITOT 1.3*  --   --   --   --   ALKPHOS 99  --   --   --   --   ALT 15  --   --   --   --   AST 15  --   --   --   --   GLUCOSE 417*  < > 144* 157* 171*  < > = values in this interval not displayed.     Recent Labs Lab 02/20/15 1047 02/20/15 1833 02/20/15 2240 02/21/15 0451  TROPONINI <0.03 <0.03 <0.03 <0.03   Lipid Panel     Component Value Date/Time   CHOL 286* 02/20/2015 1419   TRIG 108 02/20/2015 1419   HDL 63 02/20/2015 1419   CHOLHDL 4.5 02/20/2015 1419   VLDL 22 02/20/2015 1419   LDLCALC 201* 02/20/2015 1419   Drugs of Abuse     Component Value Date/Time   LABOPIA NONE DETECTED 02/20/2015 0545   COCAINSCRNUR POSITIVE* 02/20/2015 0545   LABBENZ NONE DETECTED 02/20/2015 0545   AMPHETMU NONE DETECTED 02/20/2015 0545   THCU NONE DETECTED 02/20/2015 0545   LABBARB NONE DETECTED 02/20/2015 0545     Imaging/Diagnostic Tests: CXR: No active cardiopulmonary disease.  Patricia DeltonIan D Remmy Crass, MD 02/22/2015, 8:28 AM PGY-1, [facility] FPTS Intern pager: [phone], text pages  welcome

## 2015-02-22 NOTE — Progress Notes (Signed)
Patient pleasant throughout night. Patient had no complaints of pain. Nursing will continue to monitor.

## 2015-02-24 ENCOUNTER — Inpatient Hospital Stay: Payer: Medicaid Other | Admitting: Family Medicine

## 2015-03-10 ENCOUNTER — Ambulatory Visit: Payer: Medicaid Other | Admitting: Family Medicine

## 2015-03-16 ENCOUNTER — Emergency Department (HOSPITAL_COMMUNITY)
Admission: EM | Admit: 2015-03-16 | Discharge: 2015-03-16 | Disposition: A | Discharge status: Home / Self Care | Payer: Medicaid Other | Source: Home / Self Care | Attending: Emergency Medicine | Admitting: Emergency Medicine

## 2015-03-16 ENCOUNTER — Encounter (HOSPITAL_COMMUNITY): Payer: Self-pay | Admitting: Emergency Medicine

## 2015-03-16 ENCOUNTER — Emergency Department (INDEPENDENT_AMBULATORY_CARE_PROVIDER_SITE_OTHER): Payer: Medicaid Other

## 2015-03-16 DIAGNOSIS — S63501A Unspecified sprain of right wrist, initial encounter: Secondary | ICD-10-CM

## 2015-03-16 MED ORDER — TRAMADOL HCL 50 MG PO TABS: 50.0000 mg | ORAL_TABLET | Freq: Four times a day (QID) | ORAL | Status: AC | PRN

## 2015-03-16 MED ORDER — MELOXICAM 15 MG PO TABS: 15.0000 mg | ORAL_TABLET | Freq: Every day | ORAL | Status: AC

## 2015-03-16 MED ORDER — IBUPROFEN 800 MG PO TABS
ORAL_TABLET | ORAL | Status: AC
  Filled 2015-03-16: qty 1

## 2015-03-16 MED ORDER — IBUPROFEN 800 MG PO TABS
800.0000 mg | ORAL_TABLET | Freq: Once | ORAL | Status: AC
Start: 2015-03-16 — End: 2015-03-16
  Administered 2015-03-16: 800 mg via ORAL

## 2015-03-16 NOTE — ED Notes (Signed)
Pt verbalized understanding of all discharge information including prescriptions.
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
## 2015-03-16 NOTE — Discharge Instructions (Signed)
You strained your wrist. Wear the brace all the time for the next 3 days, then only when you will be using the wrist a lot. Do gentle range of motion at least 3 times a day. Take meloxicam 1 pill daily for the next week, then as needed for pain. Apply ice to the wrist at least 3 times a day. Use the tramadol as needed for severe pain. Follow-up if no improvement in 2 weeks.

## 2015-03-16 NOTE — ED Provider Notes (Signed)
CSN: [id number]     Arrival date & time 03/16/15  1152 History   First MD Initiated Contact with Patient 03/16/15 1318     Chief Complaint  Patient presents with  . Wrist Injury   (Consider location/radiation/quality/duration/timing/severity/associated sxs/prior Treatment) HPI She is a 49 year old woman here for evaluation of right wrist injury. 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 She has had pain and swelling of the wrist since this happened on Sunday. It is worse with wrist extension. She has tried wrapping it in an Ace wrap without improvement. She has been taking some pain medicine following a dental procedure.  Past Medical History  Diagnosis Date  . Diabetes mellitus     A1C 15.8   12/17/12  . Irregular heart beat   . Depression     Was taking abilify, celexa  . Herpes     Takes acyclovir BID  . Narcotic abuse     Attends NA mtgs  . Hypertension   . Anxiety   . Smoker   . Polysubstance abuse     (+) cocaine 12/15/12   Past Surgical History  Procedure Laterality Date  . Tubal ligation     Family History  Problem Relation Age of Onset  . Heart disease Mother   . Hypertension Mother   . Coronary artery disease Mother    History  Substance Use Topics  . Smoking status: Current Every Day Smoker -- 25 years    Types: Cigarettes    Last Attempt to Quit: 12/04/2010  . Smokeless tobacco: Never Used  . Alcohol Use: No     Comment: drank when she used   OB History    Gravida Para Term Preterm AB TAB SAB Ectopic Multiple Living   4 2 0  2 1 1   2      Review of Systems As in history of present illness Allergies  Review of patient's allergies indicates no known allergies.  Home Medications   Prior to Admission medications   Medication Sig Start Date End Date Taking? Authorizing Provider  acyclovir (ZOVIRAX) 400 MG tablet Take 1 tablet (400 mg  total) by mouth 2 (two) times daily. 12/30/14  Yes Latrelle DodrillBrittany J McIntyre, MD  gabapentin (NEURONTIN) 300 MG capsule Take 1 capsule (300 mg total) by mouth 3 (three) times daily. 12/29/14  Yes Latrelle DodrillBrittany J McIntyre, MD  insulin glargine (LANTUS) 100 UNIT/ML injection Inject 0.26 mLs (26 Units total) into the skin every morning. 12/24/14  Yes Raeford RazorStephen Kohut, MD  insulin lispro (HUMALOG) 100 UNIT/ML injection Inject 0.06 mLs (6 Units total) into the skin 3 (three) times daily with meals. 12/24/14  Yes Raeford RazorStephen Kohut, MD  metFORMIN (GLUCOPHAGE-XR) 750 MG 24 hr tablet Take 2 tablets (1,500 mg total) by mouth daily with supper. 10/29/13  Yes Charm RingsErin J Timber Marshman, MD  cyclobenzaprine (FLEXERIL) 10 MG tablet Take 1 tablet (10 mg total) by mouth at bedtime. Patient not taking: Reported on 02/20/2015 12/08/14   Mellody DrownLauren Parker, PA-C  HYDROcodone-acetaminophen (NORCO) 5-325 MG per tablet Take 1 tablet by mouth every 6 (six) hours as needed for moderate pain. 01/22/15   Tatyana Kirichenko, PA-C  meloxicam (MOBIC) 15 MG tablet Take 1 tablet (15 mg total) by mouth daily. 03/16/15   Charm RingsErin J Norman Piacentini, MD  traMADol (ULTRAM) 50 MG tablet Take 1 tablet (50 mg total) by mouth every 6 (six) hours as needed. 03/16/15  Charm Rings, MD   BP 137/99 mmHg  Pulse 87  Temp(Src) 98.1 F (36.7 C) (Oral)  Resp 16  SpO2 98%  LMP 03/01/2015 Physical Exam  Constitutional: She is oriented to person, place, and time. She appears well-developed and well-nourished. No distress.  Cardiovascular: Normal rate.   Pulmonary/Chest: Effort normal.  Musculoskeletal:  Right wrist: She has swelling over the ulnar aspect of her wrist. Pain with passive flexion and extension of the wrist. Also pain with pronation and supination. She is tender over the distal ulna. 2+ radial pulse. Brisk cap refill in digits.  Neurological: She is alert and oriented to person, place, and time.    ED Course  Procedures (including critical care time) Labs Review Labs Reviewed - No data  to display  Imaging Review Dg Wrist Complete Right  03/16/2015   CLINICAL DATA:  Pain following trauma ; patient hit wrist against wall  EXAM: RIGHT WRIST - COMPLETE 3+ VIEW  COMPARISON:  None.  FINDINGS: Frontal, oblique, lateral, and ulnar deviation scaphoid images were obtained. There is no fracture or dislocation. Joint spaces appear intact. No erosive change or intra-articular calcification.  IMPRESSION: No fracture or dislocation.  No appreciable arthropathic change.   Electronically Signed   By: Bretta Bang III M.D.   On: 03/16/2015 14:21     MDM   1. Right wrist sprain, initial encounter    X-ray negative for fracture. Wrist brace given. Meloxicam daily. Recommended icing. Tramadol, #15 tablets given for severe pain. Follow-up as needed.    Charm Rings, MD 03/16/15 1435

## 2015-03-17 ENCOUNTER — Ambulatory Visit: Payer: Medicaid Other | Admitting: Family Medicine

## 2015-05-27 ENCOUNTER — Emergency Department (HOSPITAL_COMMUNITY)
Admission: EM | Admit: 2015-05-27 | Discharge: 2015-05-27 | Disposition: A | Discharge status: Home / Self Care | Payer: Medicaid Other | Attending: Emergency Medicine | Admitting: Emergency Medicine

## 2015-05-27 ENCOUNTER — Encounter (HOSPITAL_COMMUNITY): Payer: Self-pay | Admitting: *Deleted

## 2015-05-27 DIAGNOSIS — F329 Major depressive disorder, single episode, unspecified: Secondary | ICD-10-CM | POA: Diagnosis not present

## 2015-05-27 DIAGNOSIS — R112 Nausea with vomiting, unspecified: Secondary | ICD-10-CM

## 2015-05-27 DIAGNOSIS — Z79899 Other long term (current) drug therapy: Secondary | ICD-10-CM | POA: Diagnosis not present

## 2015-05-27 DIAGNOSIS — B009 Herpesviral infection, unspecified: Secondary | ICD-10-CM | POA: Diagnosis not present

## 2015-05-27 DIAGNOSIS — Z720 Tobacco use: Secondary | ICD-10-CM | POA: Insufficient documentation

## 2015-05-27 DIAGNOSIS — I10 Essential (primary) hypertension: Secondary | ICD-10-CM | POA: Insufficient documentation

## 2015-05-27 DIAGNOSIS — E1165 Type 2 diabetes mellitus with hyperglycemia: Secondary | ICD-10-CM | POA: Insufficient documentation

## 2015-05-27 DIAGNOSIS — F419 Anxiety disorder, unspecified: Secondary | ICD-10-CM | POA: Diagnosis not present

## 2015-05-27 DIAGNOSIS — R109 Unspecified abdominal pain: Secondary | ICD-10-CM | POA: Diagnosis present

## 2015-05-27 LAB — URINE MICROSCOPIC-ADD ON

## 2015-05-27 LAB — URINALYSIS, ROUTINE W REFLEX MICROSCOPIC
BILIRUBIN URINE: NEGATIVE
Ketones, ur: 40 mg/dL — AB
Leukocytes, UA: NEGATIVE
Nitrite: NEGATIVE
Protein, ur: 100 mg/dL — AB
Specific Gravity, Urine: 1.037 — ABNORMAL HIGH (ref 1.005–1.030)
Urobilinogen, UA: 1 mg/dL (ref 0.0–1.0)
pH: 7 (ref 5.0–8.0)

## 2015-05-27 LAB — CBG MONITORING, ED
GLUCOSE-CAPILLARY: 337 mg/dL — AB (ref 65–99)
GLUCOSE-CAPILLARY: 236 mg/dL — AB (ref 65–99)
Glucose-Capillary: 267 mg/dL — ABNORMAL HIGH (ref 65–99)

## 2015-05-27 LAB — COMPREHENSIVE METABOLIC PANEL
ALK PHOS: 96 U/L (ref 38–126)
ALT: 12 U/L — AB (ref 14–54)
ANION GAP: 14 (ref 5–15)
AST: 14 U/L — ABNORMAL LOW (ref 15–41)
Albumin: 3.8 g/dL (ref 3.5–5.0)
BUN: 10 mg/dL (ref 6–20)
CALCIUM: 9.7 mg/dL (ref 8.9–10.3)
CHLORIDE: 95 mmol/L — AB (ref 101–111)
CO2: 27 mmol/L (ref 22–32)
Creatinine, Ser: 1.16 mg/dL — ABNORMAL HIGH (ref 0.44–1.00)
GFR calc Af Amer: >60 mL/min (ref >60)
GFR, EST NON AFRICAN AMERICAN: 55 mL/min — AB (ref >60)
GLUCOSE: 341 mg/dL — AB (ref 65–99)
POTASSIUM: 4 mmol/L (ref 3.5–5.1)
Sodium: 136 mmol/L (ref 135–145)
TOTAL PROTEIN: 7.2 g/dL (ref 6.5–8.1)
Total Bilirubin: 1.1 mg/dL (ref 0.3–1.2)

## 2015-05-27 LAB — CBC
HCT: 47.3 % — ABNORMAL HIGH (ref 36.0–46.0)
HEMOGLOBIN: 16.5 g/dL — AB (ref 12.0–15.0)
MCH: 28.9 pg (ref 26.0–34.0)
MCHC: 34.9 g/dL (ref 30.0–36.0)
MCV: 83 fL (ref 78.0–100.0)
Platelets: 220 10*3/uL (ref 150–400)
RBC: 5.7 MIL/uL — AB (ref 3.87–5.11)
RDW: 14 % (ref 11.5–15.5)
WBC: 6.8 10*3/uL (ref 4.0–10.5)

## 2015-05-27 LAB — I-STAT VENOUS BLOOD GAS, ED
Acid-Base Excess: 2 mmol/L (ref 0.0–2.0)
BICARBONATE: 28.5 meq/L — AB (ref 20.0–24.0)
O2 SAT: 66 %
PH VEN: 7.383 — AB (ref 7.250–7.300)
PO2 VEN: 35 mmHg (ref 30.0–45.0)
TCO2: 30 mmol/L (ref 0–100)
pCO2, Ven: 47.8 mmHg (ref 45.0–50.0)

## 2015-05-27 LAB — LIPASE, BLOOD: LIPASE: 14 U/L — AB (ref 22–51)

## 2015-05-27 MED ORDER — ONDANSETRON HCL 4 MG/2ML IJ SOLN
4.0000 mg | Freq: Once | INTRAMUSCULAR | Status: AC
  Administered 2015-05-27: 4 mg via INTRAVENOUS
  Filled 2015-05-27: qty 2

## 2015-05-27 MED ORDER — SODIUM CHLORIDE 0.9 % IV BOLUS (SEPSIS)
1000.0000 mL | Freq: Once | INTRAVENOUS | Status: AC
  Administered 2015-05-27: 1000 mL via INTRAVENOUS

## 2015-05-27 MED ORDER — PROMETHAZINE HCL 25 MG/ML IJ SOLN
25.0000 mg | Freq: Once | INTRAMUSCULAR | Status: AC
  Administered 2015-05-27: 25 mg via INTRAVENOUS
  Filled 2015-05-27: qty 1

## 2015-05-27 MED ORDER — INSULIN ASPART 100 UNIT/ML ~~LOC~~ SOLN
5.0000 [IU] | Freq: Once | SUBCUTANEOUS | Status: AC
  Administered 2015-05-27: 5 [IU] via SUBCUTANEOUS
  Filled 2015-05-27: qty 1

## 2015-05-27 MED ORDER — SODIUM CHLORIDE 0.9 % IV SOLN
1000.0000 mL | Freq: Once | INTRAVENOUS | Status: AC
  Administered 2015-05-27: 1000 mL via INTRAVENOUS

## 2015-05-27 MED ORDER — SODIUM CHLORIDE 0.9 % IV SOLN: 1000.0000 mL | INTRAVENOUS | Status: DC

## 2015-05-27 MED ORDER — ONDANSETRON 4 MG PO TBDP: 4.0000 mg | ORAL_TABLET | ORAL | Status: AC | PRN

## 2015-05-27 NOTE — ED Notes (Signed)
Pt states acute onset emesis and generalized abdominal pain since this am.  States she took her insulin today but did not take her blood sugar.  States last time she felt like this her "gap was open".

## 2015-05-27 NOTE — ED Provider Notes (Signed)
CSN: [id number]     Arrival date & time 05/27/15  1106 History   None    Chief Complaint  Patient presents with  . Emesis  . Abdominal Pain     (Consider location/radiation/quality/duration/timing/severity/associated sxs/prior Treatment) HPI Patient states she started getting some abdominal cramping about 4 this morning. By about 6 AM she started vomiting. She reports multiple episodes of vomiting since then. She denies any diarrhea or fever. She reports that she continues to have cramping abdominal pain as well. She reports that she did eat and foods typical for her yesterday evening and did not think that this was due to anything she had eaten. She has had similar symptoms in association with her diabetes. The patient poor she has been taking her insulin as prescribed. She denies any urinary symptoms of pain burning or urgency. She states that she felt well yesterday. Past Medical History  Diagnosis Date  . Diabetes mellitus     A1C 15.8   12/17/12  . Irregular heart beat   . Depression     Was taking abilify, celexa  . Herpes     Takes acyclovir BID  . Narcotic abuse     Attends NA mtgs  . Hypertension   . Anxiety   . Smoker   . Polysubstance abuse     (+) cocaine 12/15/12   Past Surgical History  Procedure Laterality Date  . Tubal ligation     Family History  Problem Relation Age of Onset  . Heart disease Mother   . Hypertension Mother   . Coronary artery disease Mother    History  Substance Use Topics  . Smoking status: Current Every Day Smoker -- 1.00 packs/day for 25 years    Types: Cigarettes    Last Attempt to Quit: 12/04/2010  . Smokeless tobacco: Never Used  . Alcohol Use: No     Comment: drank when she used   OB History    Gravida Para Term Preterm AB TAB SAB Ectopic Multiple Living   4 2 0  Review of Systems  10 Systems reviewed and are negative for acute change except as noted in the HPI.   Allergies  Review of patient's allergies  indicates no known allergies.  Home Medications   Prior to Admission medications   Medication Sig Start Date End Date Taking? Authorizing Provider  acyclovir (ZOVIRAX) 400 MG tablet Take 1 tablet (400 mg total) by mouth 2 (two) times daily. 12/30/14   Latrelle Dodrill, MD  cyclobenzaprine (FLEXERIL) 10 MG tablet Take 1 tablet (10 mg total) by mouth at bedtime. Patient not taking: Reported on 02/20/2015 12/08/14   Mellody Drown, PA-C  gabapentin (NEURONTIN) 300 MG capsule Take 1 capsule (300 mg total) by mouth 3 (three) times daily. 12/29/14   Latrelle Dodrill, MD  HYDROcodone-acetaminophen (NORCO) 5-325 MG per tablet Take 1 tablet by mouth every 6 (six) hours as needed for moderate pain. 01/22/15   Tatyana Kirichenko, PA-C  insulin glargine (LANTUS) 100 UNIT/ML injection Inject 0.26 mLs (26 Units total) into the skin every morning. 12/24/14   Raeford Razor, MD  insulin lispro (HUMALOG) 100 UNIT/ML injection Inject 0.06 mLs (6 Units total) into the skin 3 (three) times daily with meals. 12/24/14   Raeford Razor, MD  meloxicam (MOBIC) 15 MG tablet Take 1 tablet (15 mg total) by mouth daily. 03/16/15   Charm Rings, MD  metFORMIN (GLUCOPHAGE-XR) 750 MG 24 hr tablet  Take 2 tablets (1,500 mg total) by mouth daily with supper. 10/29/13   Charm Rings, MD  ondansetron (ZOFRAN ODT) 4 MG disintegrating tablet Take 1 tablet (4 mg total) by mouth every 4 (four) hours as needed for nausea or vomiting. 05/27/15   Arby Barrette, MD  traMADol (ULTRAM) 50 MG tablet Take 1 tablet (50 mg total) by mouth every 6 (six) hours as needed. 03/16/15   Charm Rings, MD   BP 160/88 mmHg  Pulse 85  Temp(Src) 97.5 F (36.4 C) (Oral)  Resp 18  Ht 5\' 11"  (1.803 m)  Wt 172 lb 8 oz (78.245 kg)  BMI 24.07 kg/m2  SpO2 100% Physical Exam  Constitutional: She is oriented to person, place, and time. She appears well-developed and well-nourished.  The patient is ambulatory to the bathroom. She is not toxic in appearance. She does  appear uncomfortable. Her skin is warm and dry and there is no respiratory distress.  HENT:  Head: Normocephalic and atraumatic.  Nose: Nose normal.  Mouth/Throat: Oropharynx is clear and moist.  Eyes: EOM are normal. Pupils are equal, round, and reactive to light.  Neck: Neck supple.  Cardiovascular: Normal rate, regular rhythm, normal heart sounds and intact distal pulses.   Pulmonary/Chest: Effort normal and breath sounds normal.  Abdominal: Soft. Bowel sounds are normal. She exhibits no distension. There is tenderness.  Patient endorses general pain to palpation in the central abdomen. No guarding no rebound  Musculoskeletal: Normal range of motion. She exhibits no edema or tenderness.  Neurological: She is alert and oriented to person, place, and time. She has normal strength. No cranial nerve deficit. She exhibits normal muscle tone. Coordination normal. GCS eye subscore is 4. GCS verbal subscore is 5. GCS motor subscore is 6.  Skin: Skin is warm, dry and intact.  Psychiatric: She has a normal mood and affect.    ED Course  Procedures (including critical care time) Labs Review Labs Reviewed  CBC - Abnormal; Notable for the following:    RBC 5.70 (*)    Hemoglobin 16.5 (*)    HCT 47.3 (*)    All other components within normal limits  COMPREHENSIVE METABOLIC PANEL - Abnormal; Notable for the following:    Chloride 95 (*)    Glucose, Bld 341 (*)    Creatinine, Ser 1.16 (*)    AST 14 (*)    ALT 12 (*)    GFR calc non Af Amer 55 (*)    All other components within normal limits  URINALYSIS, ROUTINE W REFLEX MICROSCOPIC (NOT AT [facility]) - Abnormal; Notable for the following:    Specific Gravity, Urine 1.037 (*)    Glucose, UA >1000 (*)    Hgb urine dipstick SMALL (*)    Ketones, ur 40 (*)    Protein, ur 100 (*)    All other components within normal limits  LIPASE, BLOOD - Abnormal; Notable for the following:    Lipase 14 (*)    All other components within normal limits  CBG  MONITORING, ED - Abnormal; Notable for the following:    Glucose-Capillary 337 (*)    All other components within normal limits  I-STAT VENOUS BLOOD GAS, ED - Abnormal; Notable for the following:    pH, Ven 7.383 (*)    Bicarbonate 28.5 (*)    All other components within normal limits  CBG MONITORING, ED - Abnormal; Notable for the following:    Glucose-Capillary 267 (*)    All other components within  normal limits  CBG MONITORING, ED - Abnormal; Notable for the following:    Glucose-Capillary 236 (*)    All other components within normal limits  URINE MICROSCOPIC-ADD ON  BLOOD GAS, VENOUS    Imaging Review No results found.   EKG Interpretation None      MDM   Final diagnoses:  Hyperglycemia due to type 2 diabetes mellitus  Non-intractable vomiting with nausea, vomiting of unspecified type   Patient does not have acidosis or anion gap today. Patient's abdominal examination is nonsurgical. She has been hydrated and blood sugar is in the low 200s now. Because she is safe for discharge and continued home management with her insulin and Zofran as needed. Patient has been instructed on signs and symptoms were to return and close follow-up with her family doctor.    Arby Barrette, MD 05/27/15 1700

## 2015-05-27 NOTE — ED Notes (Signed)
Dr. Lowella Bandy at bedside.

## 2015-05-27 NOTE — ED Notes (Signed)
CBG 337 

## 2015-05-27 NOTE — ED Notes (Signed)
CBG 267 

## 2015-05-27 NOTE — ED Notes (Signed)
Pt verbalizes understanding of d/c instructions and denies any further needs at this time. 

## 2015-05-27 NOTE — Discharge Instructions (Signed)
Blood Glucose Monitoring °Monitoring your blood glucose (also know as blood sugar) helps you to manage your diabetes. It also helps you and your health care provider monitor your diabetes and determine how well your treatment plan is working. °WHY SHOULD YOU MONITOR YOUR BLOOD GLUCOSE? °· It can help you understand how food, exercise, and medicine affect your blood glucose. °· It allows you to know what your blood glucose is at any given moment. You can quickly tell if you are having low blood glucose (hypoglycemia) or high blood glucose (hyperglycemia). °· It can help you and your health care provider know how to adjust your medicines. °· It can help you understand how to manage an illness or adjust medicine for exercise. °WHEN SHOULD YOU TEST? °Your health care provider will help you decide how often you should check your blood glucose. This may depend on the type of diabetes you have, your diabetes control, or the types of medicines you are taking. Be sure to write down all of your blood glucose readings so that this information can be reviewed with your health care provider. See below for examples of testing times that your health care provider may suggest. °Type 1 Diabetes °· Test 4 times a day if you are in good control, using an insulin pump, or perform multiple daily injections. °· If your diabetes is not well controlled or if you are sick, you may need to monitor more often. °· It is a good idea to also monitor: °· Before and after exercise. °· Between meals and 2 hours after a meal. °· Occasionally between 2:00 a.m. and 3:00 a.m. °Type 2 Diabetes °· It can vary with each person, but generally, if you are on insulin, test 4 times a day. °· If you take medicines by mouth (orally), test 2 times a day. °· If you are on a controlled diet, test once a day. °· If your diabetes is not well controlled or if you are sick, you may need to monitor more often. °HOW TO MONITOR YOUR BLOOD GLUCOSE °Supplies  Needed °· Blood glucose meter. °· Test strips for your meter. Each meter has its own strips. You must use the strips that go with your own meter. °· A pricking needle (lancet). °· A device that holds the lancet (lancing device). °· A journal or log book to write down your results. °Procedure °· Wash your hands with soap and water. Alcohol is not preferred. °· Prick the side of your finger (not the tip) with the lancet. °· Gently milk the finger until a small drop of blood appears. °· Follow the instructions that come with your meter for inserting the test strip, applying blood to the strip, and using your blood glucose meter. °Other Areas to Get Blood for Testing °Some meters allow you to use other areas of your body (other than your finger) to test your blood. These areas are called alternative sites. The most common alternative sites are: °· The forearm. °· The thigh. °· The back area of the lower leg. °· The palm of the hand. °The blood flow in these areas is slower. Therefore, the blood glucose values you get may be delayed, and the numbers are different from what you would get from your fingers. Do not use alternative sites if you think you are having hypoglycemia. Your reading will not be accurate. Always use a finger if you are having hypoglycemia. Also, if you cannot feel your lows (hypoglycemia unawareness), always use your fingers for your   blood glucose checks. ADDITIONAL TIPS FOR GLUCOSE MONITORING  Do not reuse lancets.  Always carry your supplies with you.  All blood glucose meters have a 24-hour "hotline" number to call if you have questions or need help.  Adjust (calibrate) your blood glucose meter with a control solution after finishing a few boxes of strips. BLOOD GLUCOSE RECORD KEEPING It is a good idea to keep a daily record or log of your blood glucose readings. Most glucose meters, if not all, keep your glucose records stored in the meter. Some meters come with the ability to download  your records to your home computer. Keeping a record of your blood glucose readings is especially helpful if you are wanting to look for patterns. Make notes to go along with the blood glucose readings because you might forget what happened at that exact time. Keeping good records helps you and your health care provider to work together to achieve good diabetes management.  Document Released: 12/07/2003 Document Revised: 04/20/2014 Document Reviewed: 04/28/2013 [facility] Patient Information 2015 [place], [place]. This information is not intended to replace advice given to you by your health care provider. Make sure you discuss any questions you have with your health care provider. Type 2 Diabetes Mellitus Type 2 diabetes mellitus, often simply referred to as type 2 diabetes, is a long-lasting (chronic) disease. In type 2 diabetes, the pancreas does not make enough insulin (a hormone), the cells are less responsive to the insulin that is made (insulin resistance), or both. Normally, insulin moves sugars from food into the tissue cells. The tissue cells use the sugars for energy. The lack of insulin or the lack of normal response to insulin causes excess sugars to build up in the blood instead of going into the tissue cells. As a result, high blood sugar (hyperglycemia) develops. The effect of high sugar (glucose) levels can cause many complications. Type 2 diabetes was also previously called adult-onset diabetes, but it can occur at any age.  RISK FACTORS  A person is predisposed to developing type 2 diabetes if someone in the family has the disease and also has one or more of the following primary risk factors:  Overweight.  An inactive lifestyle.  A history of consistently eating high-calorie foods. Maintaining a normal weight and regular physical activity can reduce the chance of developing type 2 diabetes. SYMPTOMS  A person with type 2 diabetes may not show symptoms initially. The symptoms of  type 2 diabetes appear slowly. The symptoms include:  Increased thirst (polydipsia).  Increased urination (polyuria).  Increased urination during the night (nocturia).  Weight loss. This weight loss may be rapid.  Frequent, recurring infections.  Tiredness (fatigue).  Weakness.  Vision changes, such as blurred vision.  Fruity smell to your breath.  Abdominal pain.  Nausea or vomiting.  Cuts or bruises which are slow to heal.  Tingling or numbness in the hands or feet. DIAGNOSIS Type 2 diabetes is frequently not diagnosed until complications of diabetes are present. Type 2 diabetes is diagnosed when symptoms or complications are present and when blood glucose levels are increased. Your blood glucose level may be checked by one or more of the following blood tests:  A fasting blood glucose test. You will not be allowed to eat for at least 8 hours before a blood sample is taken.  A random blood glucose test. Your blood glucose is checked at any time of the day regardless of when you ate.  A hemoglobin A1c blood glucose test.  A hemoglobin A1c test provides information about blood glucose control over the previous 3 months.  An oral glucose tolerance test (OGTT). Your blood glucose is measured after you have not eaten (fasted) for 2 hours and then after you drink a glucose-containing beverage. TREATMENT   You may need to take insulin or diabetes medicine daily to keep blood glucose levels in the desired range.  If you use insulin, you may need to adjust the dosage depending on the carbohydrates that you eat with each meal or snack. The treatment goal is to maintain the before meal blood sugar (preprandial glucose) level at 70-130 mg/dL. HOME CARE INSTRUCTIONS   Have your hemoglobin A1c level checked twice a year.  Perform daily blood glucose monitoring as directed by your health care provider.  Monitor urine ketones when you are ill and as directed by your health care  provider.  Take your diabetes medicine or insulin as directed by your health care provider to maintain your blood glucose levels in the desired range.  Never run out of diabetes medicine or insulin. It is needed every day.  If you are using insulin, you may need to adjust the amount of insulin given based on your intake of carbohydrates. Carbohydrates can raise blood glucose levels but need to be included in your diet. Carbohydrates provide vitamins, minerals, and fiber which are an essential part of a healthy diet. Carbohydrates are found in fruits, vegetables, whole grains, dairy products, legumes, and foods containing added sugars.  Eat healthy foods. You should make an appointment to see a registered dietitian to help you create an eating plan that is right for you.  Lose weight if you are overweight.  Carry a medical alert card or wear your medical alert jewelry.  Carry a 15-gram carbohydrate snack with you at all times to treat low blood glucose (hypoglycemia). Some examples of 15-gram carbohydrate snacks include:  Glucose tablets, 3 or 4.  Glucose gel, 15-gram tube.  Raisins, 2 tablespoons (24 grams).  Jelly beans, 6.  Animal crackers, 8.  Regular pop, 4 ounces (120 mL).  Gummy treats, 9.  Recognize hypoglycemia. Hypoglycemia occurs with blood glucose levels of 70 mg/dL and below. The risk for hypoglycemia increases when fasting or skipping meals, during or after intense exercise, and during sleep. Hypoglycemia symptoms can include:  Tremors or shakes.  Decreased ability to concentrate.  Sweating.  Increased heart rate.  Headache.  Dry mouth.  Hunger.  Irritability.  Anxiety.  Restless sleep.  Altered speech or coordination.  Confusion.  Treat hypoglycemia promptly. If you are alert and able to safely swallow, follow the 15:15 rule:  Take 15-20 grams of rapid-acting glucose or carbohydrate. Rapid-acting options include glucose gel, glucose tablets, or  4 ounces (120 mL) of fruit juice, regular soda, or low-fat milk.  Check your blood glucose level 15 minutes after taking the glucose.  Take 15-20 grams more of glucose if the repeat blood glucose level is still 70 mg/dL or below.  Eat a meal or snack within 1 hour once blood glucose levels return to normal.  Be alert to feeling very thirsty and urinating more frequently than usual, which are early signs of hyperglycemia. An early awareness of hyperglycemia allows for prompt treatment. Treat hyperglycemia as directed by your health care provider.  Engage in at least 150 minutes of moderate-intensity physical activity a week, spread over at least 3 days of the week or as directed by your health care provider. In addition, you should engage in  resistance exercise at least 2 times a week or as directed by your health care provider. Try to spend no more than 90 minutes at one time inactive.  Adjust your medicine and food intake as needed if you start a new exercise or sport.  Follow your sick-day plan anytime you are unable to eat or drink as usual.  Do not use any tobacco products including cigarettes, chewing tobacco, or electronic cigarettes. If you need help quitting, ask your health care provider.  Limit alcohol intake to no more than 1 drink per day for nonpregnant women and 2 drinks per day for men. You should drink alcohol only when you are also eating food. Talk with your health care provider whether alcohol is safe for you. Tell your health care provider if you drink alcohol several times a week.  Keep all follow-up visits as directed by your health care provider. This is important.  Schedule an eye exam soon after the diagnosis of type 2 diabetes and then annually.  Perform daily skin and foot care. Examine your skin and feet daily for cuts, bruises, redness, nail problems, bleeding, blisters, or sores. A foot exam by a health care provider should be done annually.  Brush your teeth  and gums at least twice a day and floss at least once a day. Follow up with your dentist regularly.  Share your diabetes management plan with your workplace or school.  Stay up-to-date with immunizations. It is recommended that people with diabetes who are over 43 years old get the pneumonia vaccine. In some cases, two separate shots may be given. Ask your health care provider if your pneumonia vaccination is up-to-date.  Learn to manage stress.  Obtain ongoing diabetes education and support as needed.  Participate in or seek rehabilitation as needed to maintain or improve independence and quality of life. Request a physical or occupational therapy referral if you are having foot or hand numbness, or difficulties with grooming, dressing, eating, or physical activity. SEEK MEDICAL CARE IF:   You are unable to eat food or drink fluids for more than 6 hours.  You have nausea and vomiting for more than 6 hours.  Your blood glucose level is over 240 mg/dL.  There is a change in mental status.  You develop an additional serious illness.  You have diarrhea for more than 6 hours.  You have been sick or have had a fever for a couple of days and are not getting better.  You have pain during any physical activity.  SEEK IMMEDIATE MEDICAL CARE IF:  You have difficulty breathing.  You have moderate to large ketone levels. MAKE SURE YOU:  Understand these instructions.  Will watch your condition.  Will get help right away if you are not doing well or get worse. Document Released: 12/04/2005 Document Revised: 04/20/2014 Document Reviewed: 07/02/2012 [facility] Patient Information 2015 [place], [place]. This information is not intended to replace advice given to you by your health care provider. Make sure you discuss any questions you have with your health care provider. Nausea and Vomiting Nausea is a sick feeling that often comes before throwing up (vomiting). Vomiting is a reflex where  stomach contents come out of your mouth. Vomiting can cause severe loss of body fluids (dehydration). Children and elderly adults can become dehydrated quickly, especially if they also have diarrhea. Nausea and vomiting are symptoms of a condition or disease. It is important to find the cause of your symptoms. CAUSES   Direct irritation of  the stomach lining. This irritation can result from increased acid production (gastroesophageal reflux disease), infection, food poisoning, taking certain medicines (such as nonsteroidal anti-inflammatory drugs), alcohol use, or tobacco use.  Signals from the brain.These signals could be caused by a headache, heat exposure, an inner ear disturbance, increased pressure in the brain from injury, infection, a tumor, or a concussion, pain, emotional stimulus, or metabolic problems.  An obstruction in the gastrointestinal tract (bowel obstruction).  Illnesses such as diabetes, hepatitis, gallbladder problems, appendicitis, kidney problems, cancer, sepsis, atypical symptoms of a heart attack, or eating disorders.  Medical treatments such as chemotherapy and radiation.  Receiving medicine that makes you sleep (general anesthetic) during surgery. DIAGNOSIS Your caregiver may ask for tests to be done if the problems do not improve after a few days. Tests may also be done if symptoms are severe or if the reason for the nausea and vomiting is not clear. Tests may include:  Urine tests.  Blood tests.  Stool tests.  Cultures (to look for evidence of infection).  X-rays or other imaging studies. Test results can help your caregiver make decisions about treatment or the need for additional tests. TREATMENT You need to stay well hydrated. Drink frequently but in small amounts.You may wish to drink water, sports drinks, clear broth, or eat frozen ice pops or gelatin dessert to help stay hydrated.When you eat, eating slowly may help prevent nausea.There are also some  antinausea medicines that may help prevent nausea. HOME CARE INSTRUCTIONS   Take all medicine as directed by your caregiver.  If you do not have an appetite, do not force yourself to eat. However, you must continue to drink fluids.  If you have an appetite, eat a normal diet unless your caregiver tells you differently.  Eat a variety of complex carbohydrates (rice, wheat, potatoes, bread), lean meats, yogurt, fruits, and vegetables.  Avoid high-fat foods because they are more difficult to digest.  Drink enough water and fluids to keep your urine clear or pale yellow.  If you are dehydrated, ask your caregiver for specific rehydration instructions. Signs of dehydration may include:  Severe thirst.  Dry lips and mouth.  Dizziness.  Dark urine.  Decreasing urine frequency and amount.  Confusion.  Rapid breathing or pulse. SEEK IMMEDIATE MEDICAL CARE IF:   You have blood or brown flecks (like coffee grounds) in your vomit.  You have black or bloody stools.  You have a severe headache or stiff neck.  You are confused.  You have severe abdominal pain.  You have chest pain or trouble breathing.  You do not urinate at least once every 8 hours.  You develop cold or clammy skin.  You continue to vomit for longer than 24 to 48 hours.  You have a fever. MAKE SURE YOU:   Understand these instructions.  Will watch your condition.  Will get help right away if you are not doing well or get worse. Document Released: 12/04/2005 Document Revised: 02/26/2012 Document Reviewed: 05/03/2011 [facility] Patient Information 2015 [place], [place]. This information is not intended to replace advice given to you by your health care provider. Make sure you discuss any questions you have with your health care provider.
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
## 2016-01-29 IMAGING — CT CT MAXILLOFACIAL W/O CM
3 series · 15 of 47 positions shown, 18 images · non-contrast
Comparison: None.

CLINICAL DATA: Facial pain, sinus pressure, swelling left face

EXAM:
CT MAXILLOFACIAL WITHOUT CONTRAST
TECHNIQUE: Multidetector CT imaging of the maxillofacial structures was
performed. Multiplanar CT image reconstructions were also generated.
A small metallic BB was placed on the right temple in order to
reliably differentiate right from left.

[Series 2: facial/ orbits 2.0 h30s · axial · 0.37mm/px · z∈[-259,-113]mm · 9 of 85 slices shown, 12 images]
[im 6/85  brain]
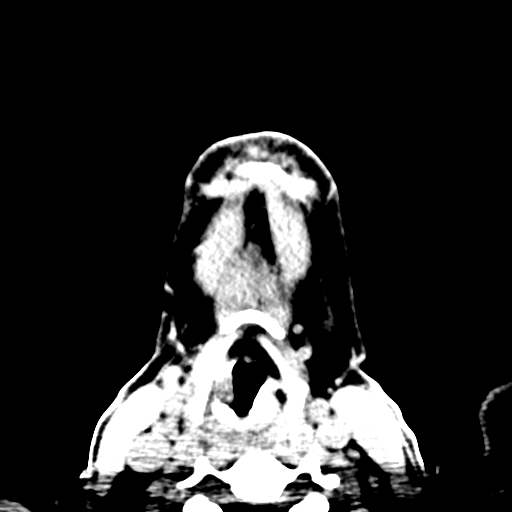
[im 6/85  bone]
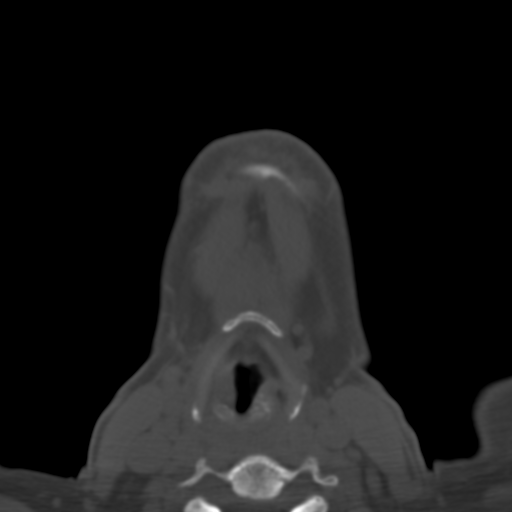
[im 15/85  bone]
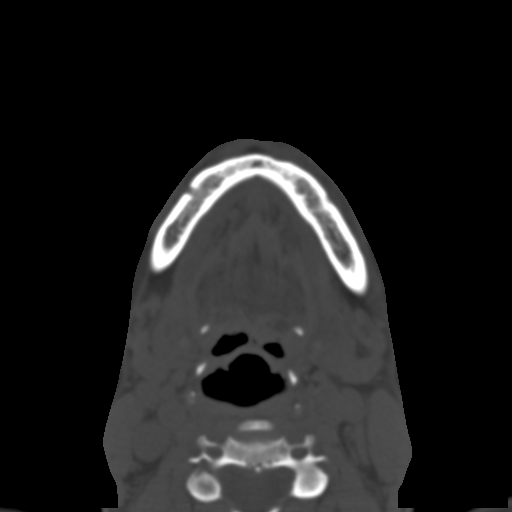
[im 24/85  bone]
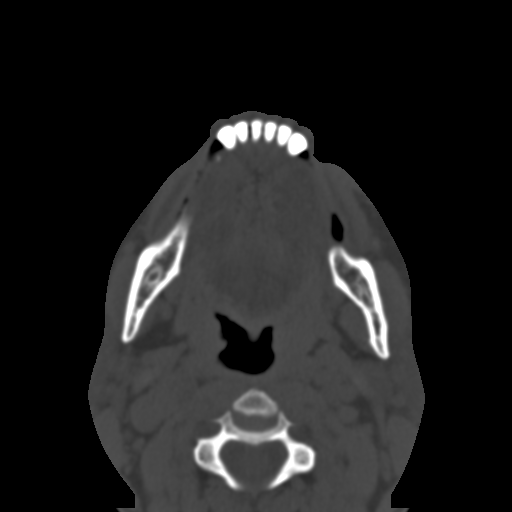
[im 32/85  bone]
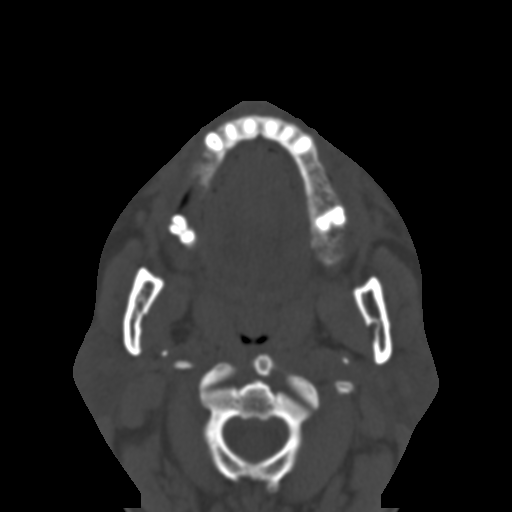
[im 44/85  brain]
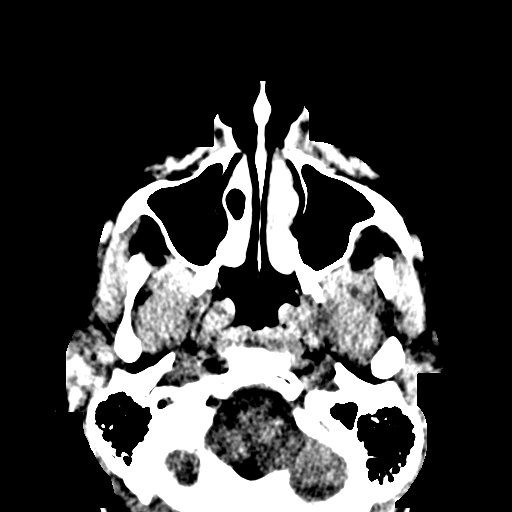
[im 44/85  bone]
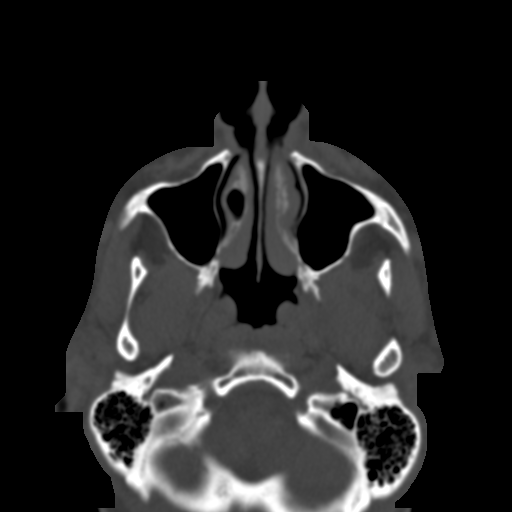
[im 53/85  bone]
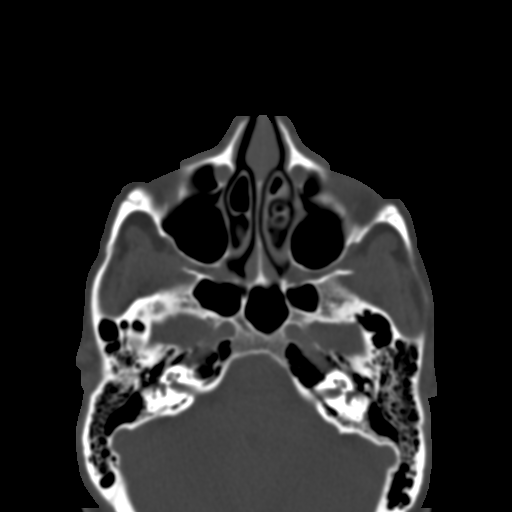
[im 61/85  bone]
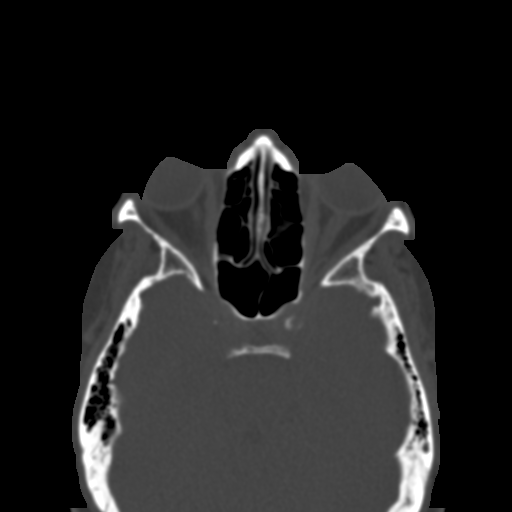
[im 70/85  bone]
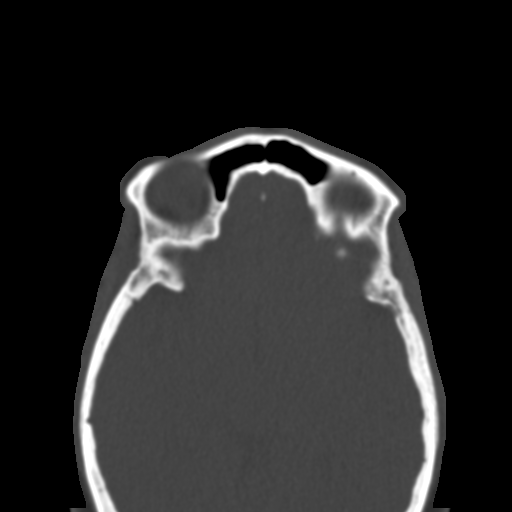
[im 79/85  brain]
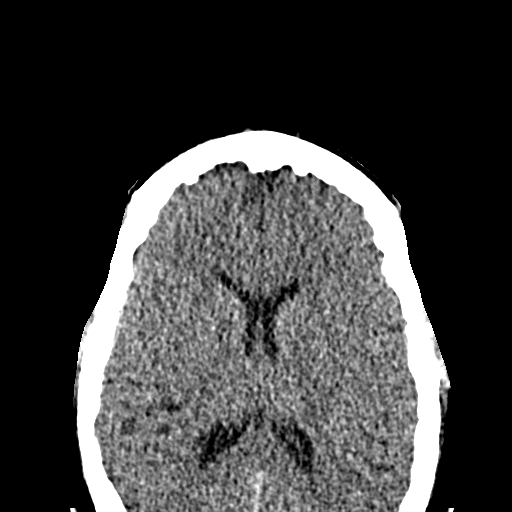
[im 79/85  bone]
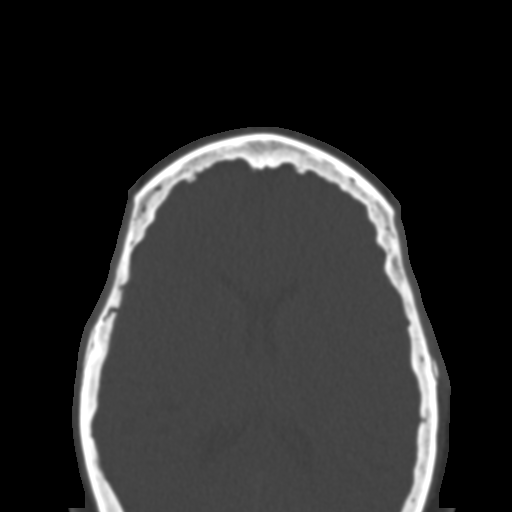

[Series 4: coronal st · coronal · 0.32mm/px · 3 of 71 slices shown]
[im 24/71  bone]
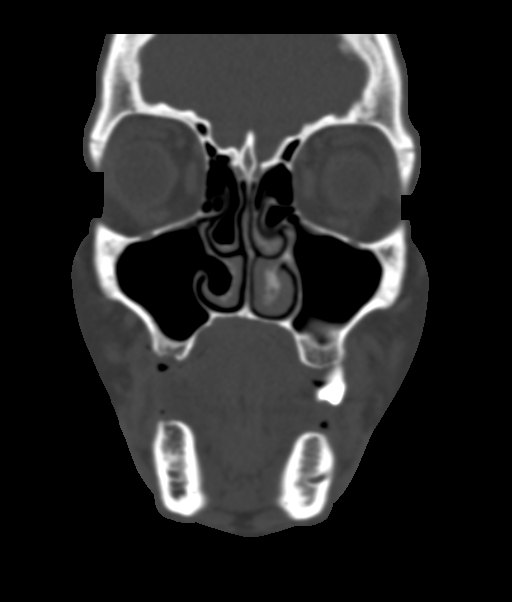
[im 32/71  bone]
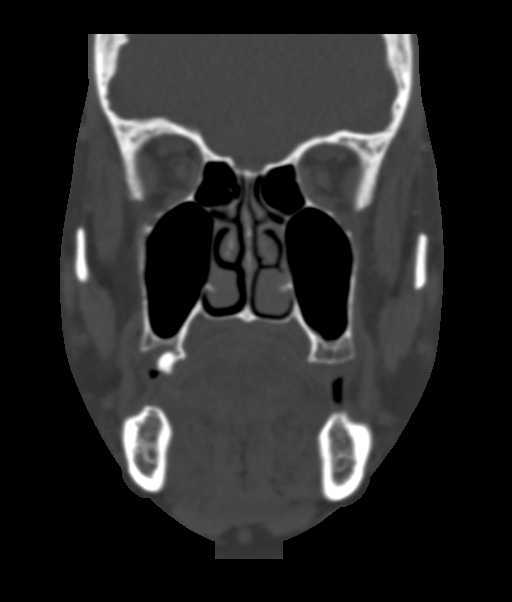
[im 39/71  bone]
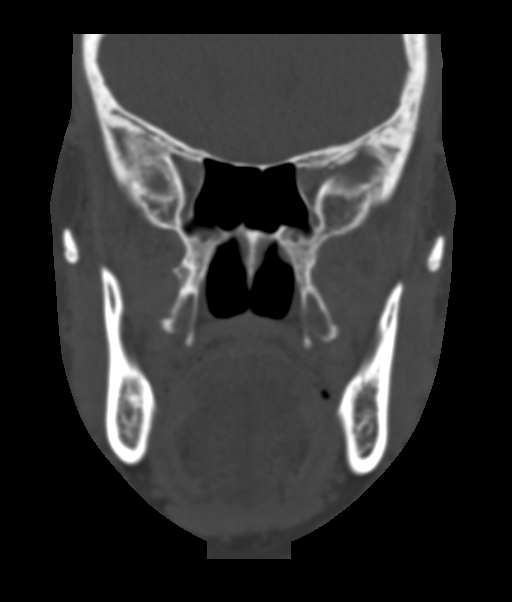

[Series 5: sagittal st · sagittal · 0.38mm/px · 3 of 77 slices shown]
[im 26/77  bone]
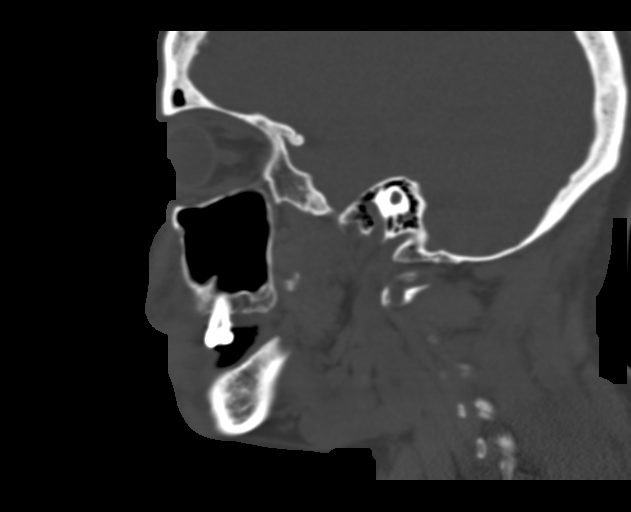
[im 39/77  bone]
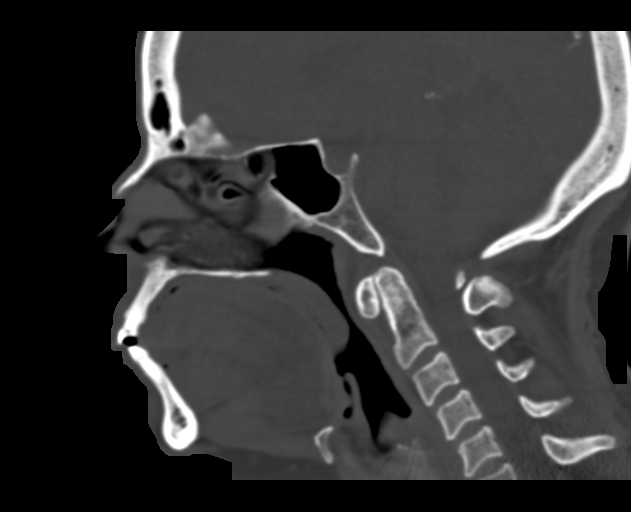
[im 51/77  bone]
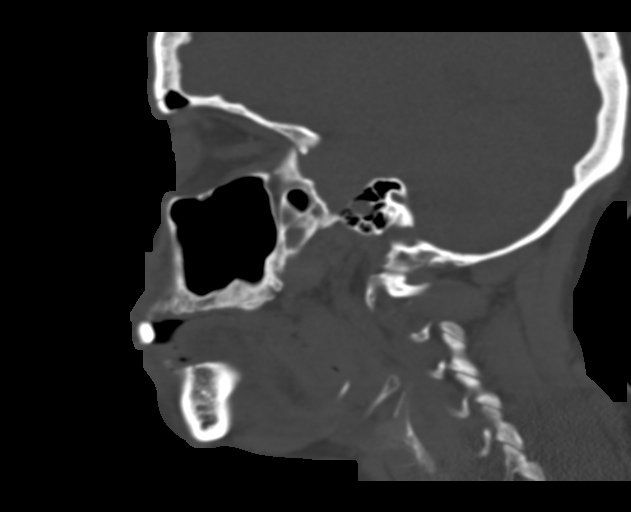

[15 of 47 positions shown; findings below may reference images not displayed]

FINDINGS: Axial images shows no nasal bone fracture. No paranasal sinuses
mucosal thickening or air-fluid levels. The mastoid air cells are
unremarkable. Nasal septum is midline.

No intraorbital hematoma. Bilateral eye globes are symmetrical in
appearance. No facial fluid collection.

Coronal images shows minimal left deviation of mid nasal bony
septum. There is mild nasal mucosal thickening left inferior
turbinate. Bilateral semilunar canal is patent. Minimal mucosal
thickening of left middle turbinate. Mild narrowing of mid and
inferior left nasal airway.

The mandible is unremarkable.  No TMJ dislocation.

Sagittal images shows patent nasopharyngeal and oropharyngeal
airway. The visualized upper cervical spine is unremarkable.
IMPRESSION: 1. No paranasal sinuses mucosal thickening or air-fluid levels.
2. Minimal left deviation of mid aspect of nasal bony septum.
3. No facial fractures of facial fluid collection.
4. No intraorbital hematoma.
5. There is nasal mucosal thickening left inferior turbinate.
Minimal mucosal thickening left middle turbinate. Mild crowding of
left nasal airway. Bilateral semilunar canal is patent.

## 2016-02-27 IMAGING — CR DG CHEST 2V
2 series · 2 of 2 positions shown · non-contrast
Comparison: 12/15/2012

CLINICAL DATA: Emesis for 3 days. Diabetes and hypertension.
Smoker. High blood sugar.

EXAM:
CHEST  2 VIEW

[chest pa]
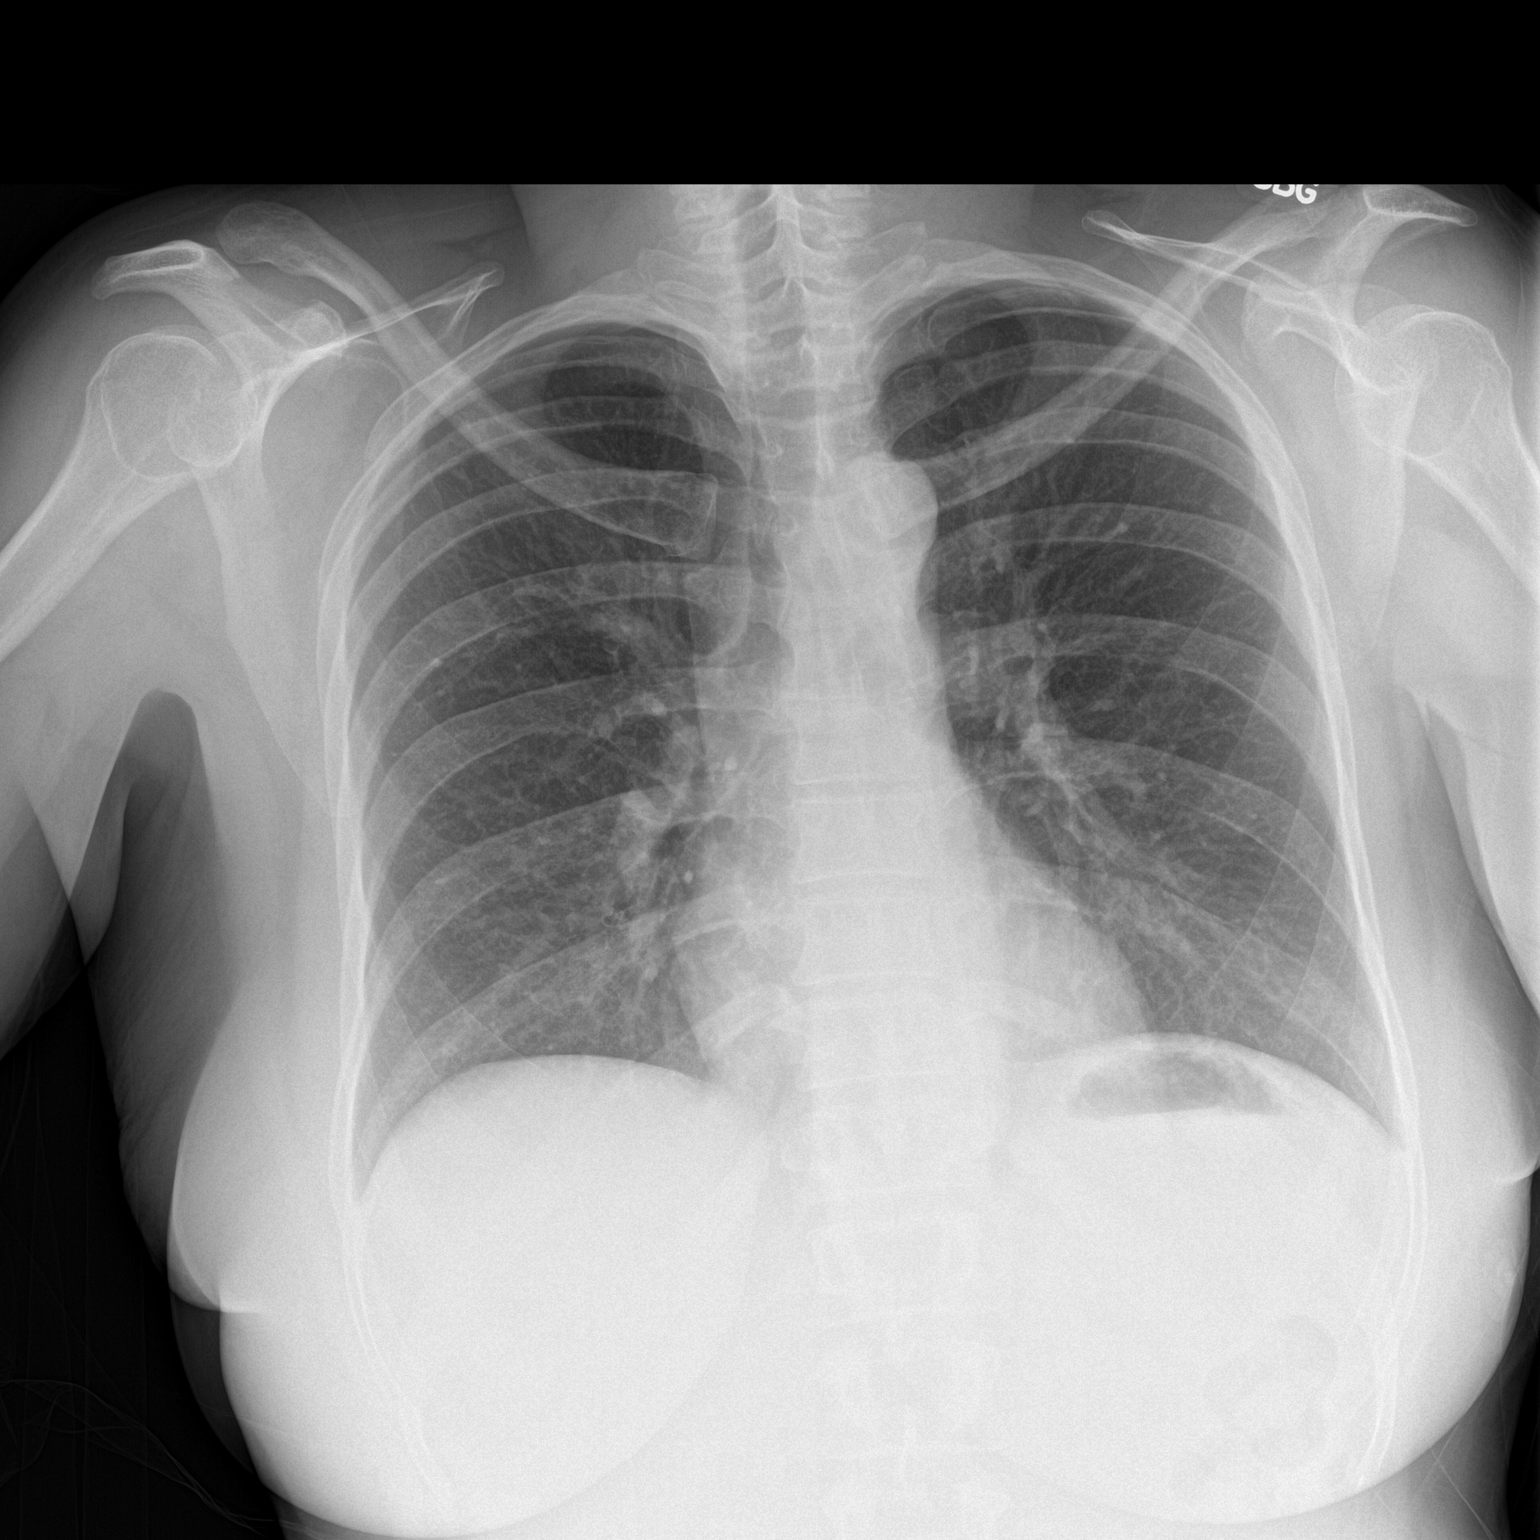

[chest lat]
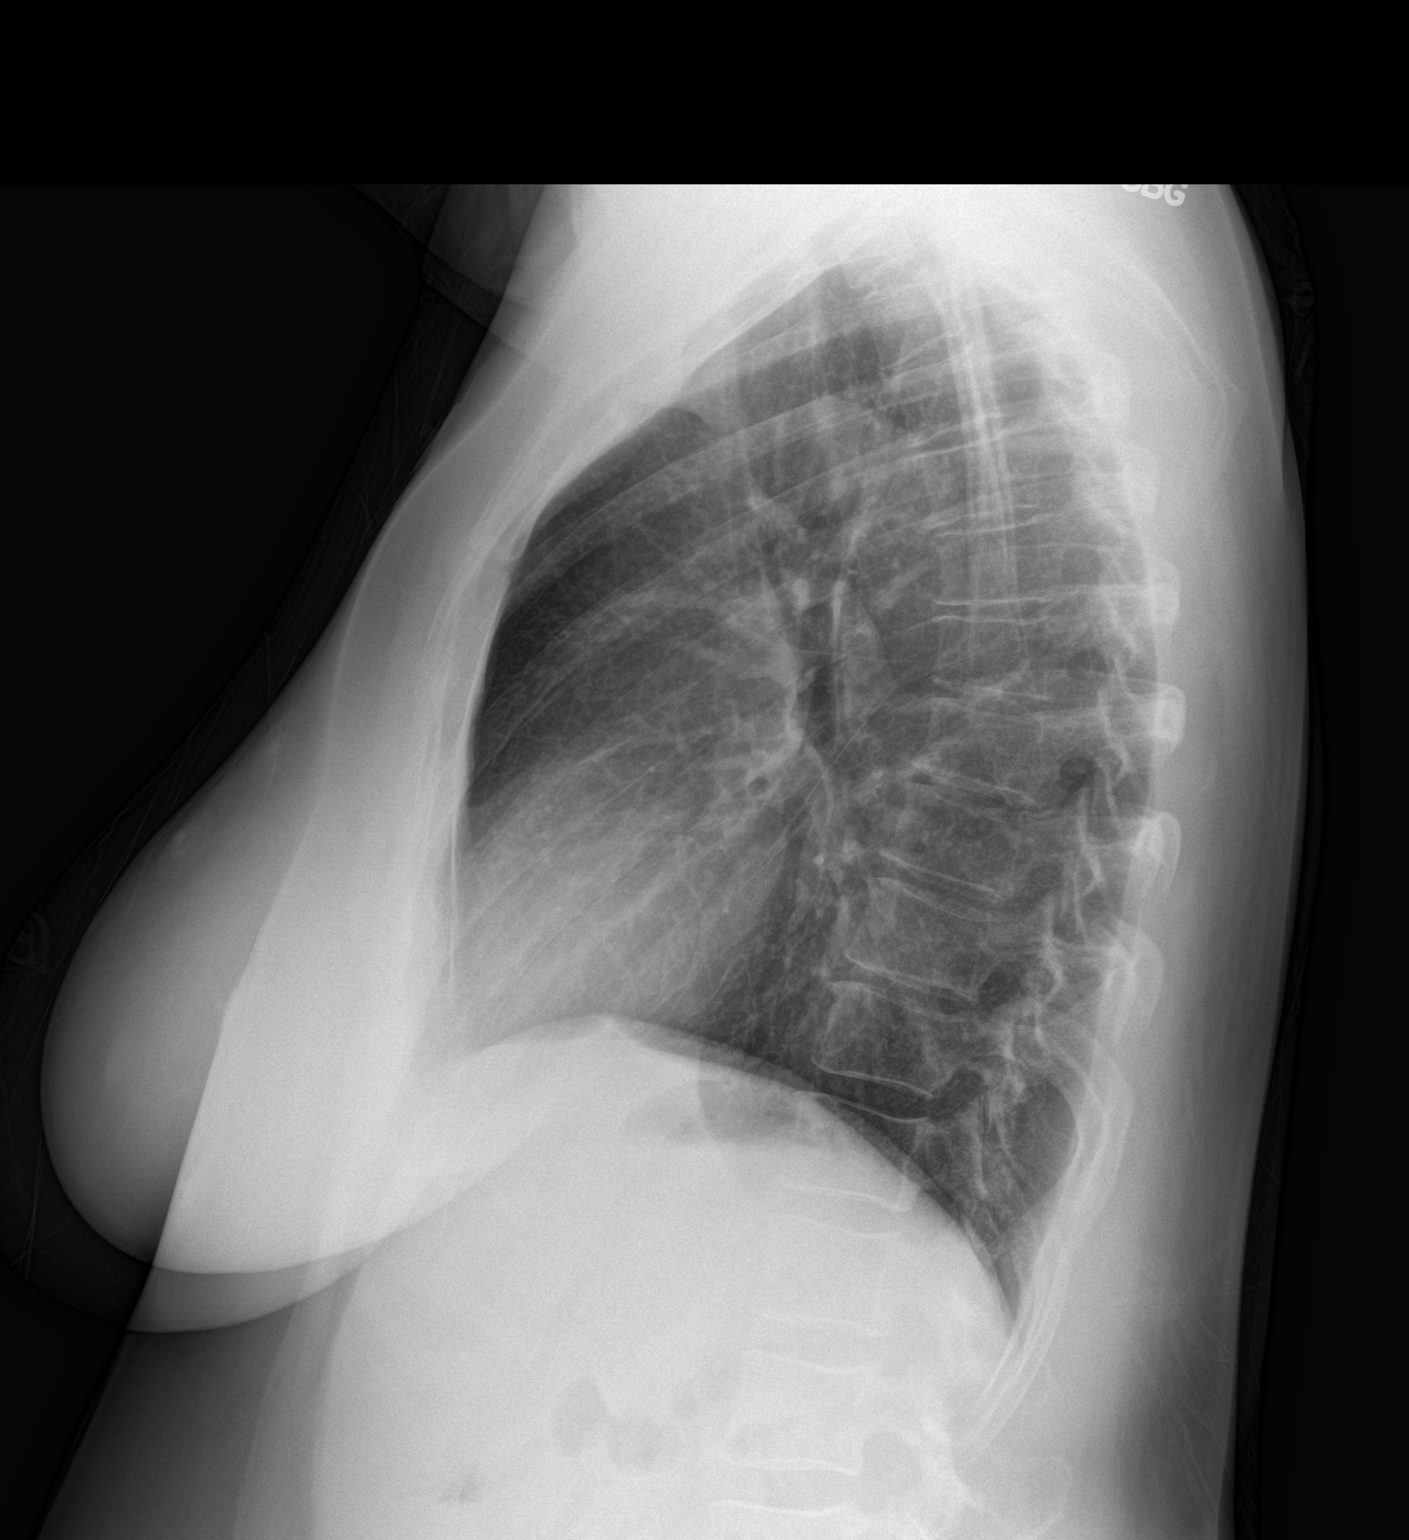

[2 of 2 positions shown; findings below may reference images not displayed]

FINDINGS: Midline trachea.  Normal heart size and mediastinal contours.

Sharp costophrenic angles.  No pneumothorax.  Clear lungs.
IMPRESSION: No active cardiopulmonary disease.

## 2021-05-18 DEATH — deceased
